# Patient Record
Sex: Male | Born: 1990 | Race: White | Hispanic: No | Marital: Single | Smoking: Former smoker
Health system: Southern US, Community
[De-identification: ages and names within clinical notes are randomized; demographics above are authoritative.]

## PROBLEM LIST (undated history)

## (undated) ENCOUNTER — Emergency Department (HOSPITAL_COMMUNITY): Payer: Commercial Managed Care - HMO

## (undated) DIAGNOSIS — F419 Anxiety disorder, unspecified: Secondary | ICD-10-CM

## (undated) DIAGNOSIS — F329 Major depressive disorder, single episode, unspecified: Secondary | ICD-10-CM

## (undated) DIAGNOSIS — Z8619 Personal history of other infectious and parasitic diseases: Secondary | ICD-10-CM

## (undated) DIAGNOSIS — B360 Pityriasis versicolor: Secondary | ICD-10-CM

## (undated) DIAGNOSIS — F32A Depression, unspecified: Secondary | ICD-10-CM

## (undated) DIAGNOSIS — K921 Melena: Secondary | ICD-10-CM

## (undated) DIAGNOSIS — F909 Attention-deficit hyperactivity disorder, unspecified type: Secondary | ICD-10-CM

## (undated) DIAGNOSIS — S62109A Fracture of unspecified carpal bone, unspecified wrist, initial encounter for closed fracture: Secondary | ICD-10-CM

## (undated) DIAGNOSIS — F17200 Nicotine dependence, unspecified, uncomplicated: Secondary | ICD-10-CM

## (undated) DIAGNOSIS — S62002K Unspecified fracture of navicular [scaphoid] bone of left wrist, subsequent encounter for fracture with nonunion: Secondary | ICD-10-CM

## (undated) HISTORY — DX: Pityriasis versicolor: B36.0

## (undated) HISTORY — DX: Anxiety disorder, unspecified: F41.9

## (undated) HISTORY — DX: Melena: K92.1

## (undated) HISTORY — DX: Depression, unspecified: F32.A

## (undated) HISTORY — PX: NO PAST SURGERIES: SHX2092

## (undated) HISTORY — DX: Nicotine dependence, unspecified, uncomplicated: F17.200

## (undated) HISTORY — DX: Major depressive disorder, single episode, unspecified: F32.9

## (undated) HISTORY — DX: Fracture of unspecified carpal bone, unspecified wrist, initial encounter for closed fracture: S62.109A

## (undated) HISTORY — DX: Personal history of other infectious and parasitic diseases: Z86.19

## (undated) HISTORY — DX: Attention-deficit hyperactivity disorder, unspecified type: F90.9

---
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
## 2011-07-10 ENCOUNTER — Encounter: Payer: Self-pay | Admitting: *Deleted

## 2011-07-10 ENCOUNTER — Emergency Department (HOSPITAL_COMMUNITY): Admission: EM | Admit: 2011-07-10 | Payer: Self-pay | Source: Home / Self Care

## 2011-07-10 NOTE — ED Notes (Signed)
Pt reports adb pain/cramping for the past couple of days, reports diff having a bm

## 2012-06-30 ENCOUNTER — Emergency Department (HOSPITAL_COMMUNITY): Payer: 59

## 2012-06-30 ENCOUNTER — Encounter (HOSPITAL_COMMUNITY): Payer: Self-pay | Admitting: Emergency Medicine

## 2012-06-30 ENCOUNTER — Emergency Department (HOSPITAL_COMMUNITY)
Admission: EM | Admit: 2012-06-30 | Discharge: 2012-06-30 | Disposition: A | Discharge status: Home / Self Care | Payer: 59 | Attending: Emergency Medicine | Admitting: Emergency Medicine

## 2012-06-30 DIAGNOSIS — S060X9A Concussion with loss of consciousness of unspecified duration, initial encounter: Secondary | ICD-10-CM

## 2012-06-30 DIAGNOSIS — W06XXXA Fall from bed, initial encounter: Secondary | ICD-10-CM | POA: Insufficient documentation

## 2012-06-30 DIAGNOSIS — S060XAA Concussion with loss of consciousness status unknown, initial encounter: Secondary | ICD-10-CM | POA: Insufficient documentation

## 2012-06-30 DIAGNOSIS — S0100XA Unspecified open wound of scalp, initial encounter: Secondary | ICD-10-CM | POA: Insufficient documentation

## 2012-06-30 DIAGNOSIS — S0101XA Laceration without foreign body of scalp, initial encounter: Secondary | ICD-10-CM

## 2012-06-30 NOTE — ED Notes (Addendum)
Pt was out of town and had been drinking.  He fell asleep on a top bunk bed and fell off and hit the back of his head on the metal rail.  Pt passed out upon impact and woke up in a pool of blood from an appx 2-3 inch lac on the back of his head.  Pt states that he has been sleepy, dizzy, and nauseated since.  Has thrown up once.

## 2012-06-30 NOTE — ED Provider Notes (Signed)
History     CSN: [id number]  Arrival date & time 06/30/12  1124   First MD Initiated Contact with Patient 06/30/12 1149      Chief Complaint  Patient presents with  . Concussion    (Consider location/radiation/quality/duration/timing/severity/associated sxs/prior treatment) HPI Comments: Pt was drinking last night and was in a bunk bed and rolled out.  He hit his head and was unconscious.  Only complaint is nausea now.   Patient is a 21 y.o. male presenting with head injury. The history is provided by the patient.  Head Injury  The incident occurred 3 to 5 hours ago. He came to the ER via walk-in. The injury mechanism was a direct blow. He lost consciousness for a period of greater than 5 minutes. The volume of blood lost was minimal. The quality of the pain is described as throbbing. The pain is at a severity of 5/10. The pain is moderate. The pain has been constant since the injury. Pertinent negatives include no numbness, no blurred vision, no vomiting, patient does not experience disorientation and no weakness. Associated symptoms comments: nausea. He has tried nothing for the symptoms. The treatment provided no relief.    Past Medical History  Diagnosis Date  . Gall stone     History reviewed. No pertinent past surgical history.  History reviewed. No pertinent family history.  History  Substance Use Topics  . Smoking status: Current Every Day Smoker -- 0.5 packs/day  . Smokeless tobacco: Not on file  . Alcohol Use: Yes      Review of Systems  Constitutional: Negative for fever.  Eyes: Negative for blurred vision.  Gastrointestinal: Negative for vomiting.  Neurological: Negative for dizziness, weakness and numbness.  All other systems reviewed and are negative.    Allergies  Review of patient's allergies indicates no known allergies.  Home Medications   Current Outpatient Rx  Name Route Sig Dispense Refill  . AMPHETAMINE-DEXTROAMPHET ER 20 MG PO CP24 Oral  Take 20 mg by mouth every morning.      BP 120/62  Pulse 71  Temp 98.3 F (36.8 C) (Oral)  Resp 17  SpO2 99%  Physical Exam  Nursing note and vitals reviewed. Constitutional: He is oriented to person, place, and time. He appears well-developed and well-nourished. No distress.  HENT:  Head: Normocephalic. Head is with laceration.    Mouth/Throat: Oropharynx is clear and moist.  Eyes: Conjunctivae normal and EOM are normal. Pupils are equal, round, and reactive to light.  Neck: Normal range of motion. Neck supple.  Cardiovascular: Normal rate, regular rhythm and intact distal pulses.   No murmur heard. Pulmonary/Chest: Effort normal and breath sounds normal. No respiratory distress. He has no wheezes. He has no rales.  Abdominal: Soft. He exhibits no distension. There is no tenderness. There is no rebound and no guarding.  Musculoskeletal: Normal range of motion. He exhibits no edema and no tenderness.  Neurological: He is alert and oriented to person, place, and time. He has normal strength. No cranial nerve deficit or sensory deficit.  Skin: Skin is warm and dry. No rash noted. No erythema.  Psychiatric: He has a normal mood and affect. His behavior is normal.    ED Course  Procedures (including critical care time)  Labs Reviewed - No data to display Ct Head Wo Contrast  06/30/2012  *RADIOLOGY REPORT*  Clinical Data: Larey Seat from top bunk striking back of head, syncope, laceration at back of head  CT HEAD WITHOUT CONTRAST  Technique:  Contiguous axial images were obtained from the base of the skull through the vertex without contrast.  Comparison: 12/12/2005  Findings: Normal ventricular morphology. No midline shift or mass effect. Normal appearance of brain parenchyma. No intracranial hemorrhage, mass lesion, or acute infarction. Visualized paranasal sinuses and mastoid air cells clear. Bones unremarkable.  IMPRESSION: No acute intracranial abnormalities.   Original Report  Authenticated By: Lollie Marrow, M.D.      1. Concussion   2. Scalp laceration       MDM   Patient with a fall from about bed last night where he hit his head on the side rail causing a large laceration and an unknown LOC. Patient woke up this morning around noon on the floor. Patient has a large hemostatic laceration over the back of his head that is already starting to heal. Head CT is negative the patient's neurologic exam is within normal limits. Patient appears to have a concussion but otherwise is well-appearing. He was given strict return cautioned discharged home.        Gwyneth Sprout, MD 06/30/12 1700

## 2012-08-13 ENCOUNTER — Encounter: Payer: Self-pay | Admitting: Family Medicine

## 2012-08-13 ENCOUNTER — Ambulatory Visit (INDEPENDENT_AMBULATORY_CARE_PROVIDER_SITE_OTHER): Payer: 59 | Admitting: Family Medicine

## 2012-08-13 VITALS — BP 110/75 | HR 86 | Temp 99.0°F | Ht 72.0 in | Wt 136.0 lb

## 2012-08-13 DIAGNOSIS — F32A Depression, unspecified: Secondary | ICD-10-CM

## 2012-08-13 DIAGNOSIS — B829 Intestinal parasitism, unspecified: Secondary | ICD-10-CM

## 2012-08-13 DIAGNOSIS — R195 Other fecal abnormalities: Secondary | ICD-10-CM

## 2012-08-13 DIAGNOSIS — F329 Major depressive disorder, single episode, unspecified: Secondary | ICD-10-CM

## 2012-08-13 MED ORDER — MEBENDAZOLE 100 MG PO CHEW: CHEWABLE_TABLET | ORAL | Status: DC

## 2012-08-13 MED ORDER — PAROXETINE HCL 20 MG PO TABS: 20.0000 mg | ORAL_TABLET | ORAL | Status: DC

## 2012-08-13 NOTE — Progress Notes (Signed)
Office Note 08/13/2012  CC:  Chief Complaint  Patient presents with  . Establish Care    ? pinworm, pt states he saw worm    HPI:  Nicholas Wu is a 21 y.o. White male who is here to establish care. Patient's most recent primary MD: Dr. Ludwig Clarks. Old records were not reviewed prior to or during today's visit.  Saw long (approx 6") worm in stool in toilet bowl 6 days ago.  No GI sx's except for one loose stool yesterday. He does walk around outside a lot and has dogs outside.  No foreign travel, no hx of parasite infection in the past that he knows of. Has recent hx of depression, was started on paxil 20mg  by his prior PCP a couple of weeks ago and he reports feeling better.  However, he says he lost his bottle of paxil yesterday and he asks if RF is possible.   He takes adderall for ADHD but does not request rf of this today.  Past Medical History  Diagnosis Date  . Depression   . ADHD (attention deficit hyperactivity disorder)     History reviewed. No pertinent past surgical history.  Family History  Problem Relation Age of Onset  . Heart disease Maternal Grandfather   . Mental illness Maternal Grandfather   . Diabetes Maternal Grandfather     History   Social History  . Marital Status: Single    Spouse Name: N/A    Number of Children: N/A  . Years of Education: N/A   Occupational History  . Not on file.   Social History Main Topics  . Smoking status: Current Every Day Smoker -- 0.5 packs/day    Types: Cigarettes  . Smokeless tobacco: Never Used  . Alcohol Use: Yes  . Drug Use: No  . Sexually Active: Yes -- Male partner(s)   Other Topics Concern  . Not on file   Social History Narrative   Single, no children.From [place].  Does restaurant work. Some college education--currently taking a break as of 07/2012.Exercises occasionally.  No dietary restrictions.    Outpatient Encounter Prescriptions as of 08/13/2012  Medication Sig Dispense Refill    . amphetamine-dextroamphetamine (ADDERALL XR) 20 MG 24 hr capsule Take 20 mg by mouth every morning.      Marland Kitchen PARoxetine (PAXIL) 20 MG tablet Take 1 tablet (20 mg total) by mouth every morning.  30 tablet  1  . [DISCONTINUED] PARoxetine (PAXIL) 20 MG tablet Take 20 mg by mouth every morning.      . mebendazole (VERMOX) 100 MG chewable tablet 1 tab twice daily for 3 days.  May repeat this in 3 weeks if needed.  6 tablet  1    No Known Allergies  ROS Review of Systems  Constitutional: Negative for fever and fatigue.  HENT: Negative for congestion and sore throat.   Eyes: Negative for visual disturbance.  Respiratory: Negative for cough.   Cardiovascular: Negative for chest pain.  Gastrointestinal: Negative for nausea and abdominal pain.  Genitourinary: Negative for dysuria.  Musculoskeletal: Negative for back pain and joint swelling.  Skin: Negative for rash.  Neurological: Negative for weakness and headaches.  Hematological: Negative for adenopathy.    PE; Blood pressure 110/75, pulse 86, temperature 99 F (37.2 C), temperature source Temporal, height 6' (1.829 m), weight 136 lb (61.689 kg), SpO2 98.00%. Gen: Alert, well appearing.  Patient is oriented to person, place, time, and situation. ENT:   Eyes: no injection, icteris, swelling, or exudate.  EOMI, PERRLA. Nose: no drainage or turbinate edema/swelling.  No injection or focal lesion.  Mouth: lips without lesion/swelling.  Oral mucosa pink and moist.  Dentition intact and without obvious caries or gingival swelling.  Oropharynx without erythema, exudate, or swelling.  Neck - No masses or thyromegaly or limitation in range of motion CV: RRR, no m/r/g.   LUNGS: CTA bilat, nonlabored resps, good aeration in all lung fields. ABD: soft, NT, ND, BS normal.  No hepatospenomegaly or mass.  No bruits. EXT: no clubbing, cyanosis, or edema.   Pertinent labs:  None today  ASSESSMENT AND PLAN:   New pt: obtain old records.  Parasites  in stool Mebendazole 200mg  bid x 3d, then may repeat in 3 wks if needed. Encouraged pt to always wear shoes outside.  Depression Improved per pt report. Pt says he lost his med bottle yesterday so I did a new rx for this today (paxil 20mg ). I recommended he f/u in office in 1 mo.   He declined flu vaccine today.  An After Visit Summary was printed and given to the patient.  Return in about 1 month (around 09/12/2012) for depression and ADHD.

## 2012-08-13 NOTE — Assessment & Plan Note (Signed)
Mebendazole 200mg  bid x 3d, then may repeat in 3 wks if needed. Encouraged pt to always wear shoes outside.

## 2012-08-13 NOTE — Assessment & Plan Note (Signed)
Improved per pt report. Pt says he lost his med bottle yesterday so I did a new rx for this today (paxil 20mg ). I recommended he f/u in office in 1 mo.

## 2012-08-14 ENCOUNTER — Other Ambulatory Visit: Payer: Self-pay | Admitting: Family Medicine

## 2012-08-14 ENCOUNTER — Telehealth: Payer: Self-pay | Admitting: *Deleted

## 2012-08-14 MED ORDER — ALBENDAZOLE 200 MG PO TABS: ORAL_TABLET | ORAL | Status: DC

## 2012-08-14 NOTE — Telephone Encounter (Signed)
PC from pharmacy:  Mebendazole is no longer made.  Please RX alternate med.

## 2012-08-14 NOTE — Telephone Encounter (Signed)
eRx sent for Albendazole.

## 2012-09-25 ENCOUNTER — Encounter: Payer: Self-pay | Admitting: Family Medicine

## 2012-09-25 ENCOUNTER — Ambulatory Visit: Payer: 59 | Admitting: Family Medicine

## 2012-09-25 ENCOUNTER — Ambulatory Visit (INDEPENDENT_AMBULATORY_CARE_PROVIDER_SITE_OTHER): Payer: 59 | Admitting: Family Medicine

## 2012-09-25 VITALS — BP 120/80 | HR 83 | Temp 99.0°F | Ht 72.0 in | Wt 145.0 lb

## 2012-09-25 DIAGNOSIS — F32A Depression, unspecified: Secondary | ICD-10-CM

## 2012-09-25 DIAGNOSIS — R195 Other fecal abnormalities: Secondary | ICD-10-CM

## 2012-09-25 DIAGNOSIS — F329 Major depressive disorder, single episode, unspecified: Secondary | ICD-10-CM

## 2012-09-25 DIAGNOSIS — B829 Intestinal parasitism, unspecified: Secondary | ICD-10-CM

## 2012-09-25 DIAGNOSIS — F909 Attention-deficit hyperactivity disorder, unspecified type: Secondary | ICD-10-CM

## 2012-09-25 MED ORDER — DEXTROAMPHETAMINE SULFATE 10 MG PO TABS: ORAL_TABLET | ORAL | Status: DC

## 2012-09-25 MED ORDER — PAROXETINE HCL 20 MG PO TABS: 20.0000 mg | ORAL_TABLET | ORAL | Status: DC

## 2012-09-25 NOTE — Assessment & Plan Note (Signed)
Switch from adderall XR to dexedrine per his request: 10mg  bid, #60, no RF. He says he has tolerated this well in the past and done well on it.

## 2012-09-25 NOTE — Progress Notes (Signed)
OFFICE NOTE  09/25/2012  CC:  Chief Complaint  Patient presents with  . Follow-up    depression, ADD     HPI: Patient is a 22 y.o. Caucasian male who is here for 6 wk follow up depression and ADHD. No more worms seen since treatment with abendazole 6 wks ago. Paxil x couple months--doing well. Adderall x few years.  Has been on different formulations of this med over the years, including dextroamphetamine--not the mixed salt.  He seems to get better results with switching things up periodically.  No signif side effects though he does mention adderall xr making him a bit dizzy. Occupation: Designer, fashion/clothing  Pertinent PMH:  Past Medical History  Diagnosis Date  . Depression   . ADHD (attention deficit hyperactivity disorder)     MEDS:  Outpatient Prescriptions Prior to Visit  Medication Sig Dispense Refill  . amphetamine-dextroamphetamine (ADDERALL XR) 20 MG 24 hr capsule Take 20 mg by mouth every morning.      Marland Kitchen PARoxetine (PAXIL) 20 MG tablet Take 1 tablet (20 mg total) by mouth every morning.  30 tablet  1  . [DISCONTINUED] albendazole (ALBENZA) 200 MG tablet Take 2 tabs as a single dose.  May repeat in 2-3 weeks as needed.  2 tablet  1  Last reviewed on 09/25/2012  1:28 PM by Jeoffrey Massed, MD  PE: Blood pressure 120/80, pulse 83, temperature 99 F (37.2 C), temperature source Temporal, height 6' (1.829 m), weight 145 lb (65.772 kg). Wt Readings from Last 2 Encounters:  09/25/12 145 lb (65.772 kg)  08/13/12 136 lb (61.689 kg)   Gen: alert, oriented x 4, affect pleasant.  Lucid thinking and conversation noted. HEENT: PERRLA, EOMI.   Neck: no LAD, mass, or thyromegaly. CV: RRR, no m/r/g LUNGS: CTA bilat, nonlabored. NEURO: no tremor or tics noted on observation.  Coordination intact. CN 2-12 grossly intact bilaterally, strength 5/5 in all extremeties.  No ataxia.   IMPRESSION AND PLAN:  Parasites in stool Resolved s/p abendazole.  Depression RF paxil x 1 mo supply.  He  says he wants to f/u again in 18mo in office and possibly ween off this med at that time.  ADHD (attention deficit hyperactivity disorder) Switch from adderall XR to dexedrine per his request: 10mg  bid, #60, no RF. He says he has tolerated this well in the past and done well on it.   He declined flu vaccine again today.  An After Visit Summary was printed and given to the patient.  FOLLOW UP: 18mo

## 2012-09-25 NOTE — Assessment & Plan Note (Addendum)
Resolved s/p abendazole.

## 2012-09-25 NOTE — Assessment & Plan Note (Signed)
RF paxil x 1 mo supply.  He says he wants to f/u again in 70mo in office and possibly ween off this med at that time.

## 2012-10-26 ENCOUNTER — Other Ambulatory Visit: Payer: Self-pay | Admitting: Family Medicine

## 2012-10-26 NOTE — Telephone Encounter (Signed)
Patient returned Stephanie's

## 2012-10-26 NOTE — Telephone Encounter (Signed)
Message left for pt to return call.  Pt is due for follow up. Refill request for DEXTROAMPHETAMINE Last filled- 09/25/12 Last seen- 09/25/12 Follow up - ONE MONTH.  Med change at last visit.   Per note from [place], pt is completely out of meds.

## 2012-10-26 NOTE — Telephone Encounter (Signed)
Pt returned call.  Advised he needs appt prior to being seen.  He is agreeable.  Pt states he can wait until appt on Wednesday for RX.

## 2012-10-28 ENCOUNTER — Encounter: Payer: Self-pay | Admitting: Family Medicine

## 2012-10-28 ENCOUNTER — Telehealth: Payer: Self-pay | Admitting: Family Medicine

## 2012-10-28 ENCOUNTER — Ambulatory Visit (INDEPENDENT_AMBULATORY_CARE_PROVIDER_SITE_OTHER): Payer: 59 | Admitting: Family Medicine

## 2012-10-28 VITALS — BP 120/82 | HR 88 | Ht 72.0 in | Wt 148.0 lb

## 2012-10-28 DIAGNOSIS — F32A Depression, unspecified: Secondary | ICD-10-CM

## 2012-10-28 DIAGNOSIS — F329 Major depressive disorder, single episode, unspecified: Secondary | ICD-10-CM

## 2012-10-28 DIAGNOSIS — F909 Attention-deficit hyperactivity disorder, unspecified type: Secondary | ICD-10-CM

## 2012-10-28 DIAGNOSIS — Z79899 Other long term (current) drug therapy: Secondary | ICD-10-CM

## 2012-10-28 MED ORDER — PAROXETINE HCL 10 MG PO TABS: 10.0000 mg | ORAL_TABLET | ORAL | Status: DC

## 2012-10-28 NOTE — Assessment & Plan Note (Signed)
He reports significant improvement (cites that he was so depressed he lost about 30 lbs last fall) on paxil 20mg  and would like to step down to 10mg  qd dose now.   Rx for this sent to pharmacy today, #30, RF x 1.

## 2012-10-28 NOTE — Assessment & Plan Note (Addendum)
Need to check UDS.  I did confirm with his pharmacy ([facility]) that he filled the dexedrine rx I gave him last visit on the day of that visit (09/25/12). I reiterated to patient today that he is to take this med only as prescribed (10mg  bid), and that he should use no illicit/illegal drugs. I cautioned his alcohol ingestion while on this med--encouraged him to cut back to drinking a beer or two only on the days he does not take his dexedrine. If UDS is clear (according to when he reports last having dexedrine and marijuana, his UDS should have NO amphetamines today and MAY have marijuana), will rx his dexedrine 30d supply and will have him come back a day or two BEFORE he is scheduled to run out and recheck UDS to make sure amphetamines show up in urine as they should if he is compliant with med as rx'd. Will resubmit records request to [facility] in [place].

## 2012-10-28 NOTE — Telephone Encounter (Signed)
MD aware

## 2012-10-28 NOTE — Telephone Encounter (Addendum)
Patient's mother states that patient walked into a Pharmacologist and asked for his medication & the doctor wrote Rx. Patient's mother feels that patient may not be on the best combination of meds and she requesting Dr. Milinda Cave to carefilly review patient's medications with him. She stated the [facility] nurse expressed concern also. Patient has been suspended from work & never seems to sleep. He is also self medicating himself with alcohol. Please do not mention to patient that his mother called.

## 2012-10-28 NOTE — Progress Notes (Addendum)
OFFICE NOTE  10/28/2012  CC:  Chief Complaint  Patient presents with  . Follow-up    ADHD     HPI: Patient is a 22 y.o. Caucasian male who is here for 1 mo f/u ADHD.  Started dexedrine 10mg  bid when last seen here 09/25/12.  He is also on paxil but had expressed some desire to possibly ween off this med when he was here last.   Says dexedrine was very helpful for focus/attention/hyperactivity sx's and he had no "crash" when med wore off like he did with adderall.  He took it "some days but not all" --didn't take it when he was off work.  Says he is out of the med, though b/c he was moving and says he required 3 tabs some days instead of 2.  I told him that he should only take this med as prescribed--no extra doses. Last took it 2/1 or 2/2.  I sensed this as a possible red flag regarding potential misuse of this controlled med, so I asked if he has used any controlled substances (rx or street) and he said no.  I then said that in light of this info and the fact that we still have no old records (these were requested months ago) I wanted to check a UDS today.  He then sheepishly said that he did do some marijuana about 10d ago and "this might show up".  He denied use of any other controlled substances, rx or street.  Says he would like to back down his paxil dose to 10mg  qd--says he feels like mood is stable and has been on this med for about 3 months.  Says this was started at [facility] (Dr. Arlyce Dice).  Pertinent PMH:  Past Medical History  Diagnosis Date  . Depression   . ADHD (attention deficit hyperactivity disorder)    History reviewed. No pertinent past surgical history.  MEDS:  Outpatient Prescriptions Prior to Visit  Medication Sig Dispense Refill  . dextroamphetamine (DEXTROSTAT) 10 MG tablet 1 tab po bid  60 tablet  0  . PARoxetine (PAXIL) 20 MG tablet Take 1 tablet (20 mg total) by mouth every morning.  30 tablet  0   Last reviewed on 10/28/2012  3:35 PM by Jeoffrey Massed,  MD  PE: Blood pressure 120/82, pulse 88, height 6' (1.829 m), weight 148 lb (67.132 kg). Wt Readings from Last 2 Encounters:  10/28/12 148 lb (67.132 kg)  09/25/12 145 lb (65.772 kg)    Gen: alert, oriented x 4, affect pleasant.  Lucid thinking and conversation noted. HEENT: PERRLA, EOMI.   Neck: no LAD, mass, or thyromegaly. CV: RRR, no m/r/g LUNGS: CTA bilat, nonlabored. NEURO: no tremor or tics noted on observation.  Coordination intact. CN 2-12 grossly intact bilaterally, strength 5/5 in all extremeties.  No ataxia.   IMPRESSION AND PLAN:  ADHD (attention deficit hyperactivity disorder) Need to check UDS. I reiterated to patient today that he is to take this med only as prescribed (10mg  bid), and that he should use no illicit/illegal drugs. I cautioned his alcohol ingestion while on this med--encouraged him to cut back to drinking a beer or two only on the days he does not take his dexedrine. If UDS is clear (according to when he reports last having dexedrine and marijuana, his UDS should have NO amphetamines today and MAY have marijuana), will rx his dexedrine 30d supply and will have him come back a day or two BEFORE he is scheduled to run out  and recheck UDS to make sure amphetamines show up in urine as they should if he is compliant with med as rx'd. Will resubmit records request to [facility] in [place].  Depression He reports significant improvement (cites that he was so depressed he lost about 30 lbs last fall) on paxil 20mg  and would like to step down to 10mg  qd dose now.   Rx for this sent to pharmacy today, #30, RF x 1.   No dexedrine rx given to patient today.  FOLLOW UP: approx 1 mo    ADDENDUM 10/29/12: UDS was clean, as expected. Will print 30d supply of dexedrine and he is to return for f/u prior to running out of med and get another UDS to document compliance.-PM

## 2012-10-29 ENCOUNTER — Encounter: Payer: Self-pay | Admitting: Family Medicine

## 2012-10-29 LAB — PRESCRIPTION ABUSE MONITORING 10P, URINE
Amphetamine/Meth: NEGATIVE ng/mL
Buprenorphine, Urine: NEGATIVE ng/mL
Creatinine, Urine: 108.84 mg/dL (ref >20.0)
Oxycodone Screen, Ur: NEGATIVE ng/mL
Propoxyphene: NEGATIVE ng/mL

## 2012-10-29 MED ORDER — DEXTROAMPHETAMINE SULFATE 10 MG PO TABS: ORAL_TABLET | ORAL | Status: DC

## 2012-10-29 NOTE — Addendum Note (Signed)
Addended by: Jeoffrey Massed on: 10/29/2012 10:46 AM   Modules accepted: Orders

## 2012-11-26 ENCOUNTER — Encounter: Payer: Self-pay | Admitting: Family Medicine

## 2012-11-26 ENCOUNTER — Ambulatory Visit (INDEPENDENT_AMBULATORY_CARE_PROVIDER_SITE_OTHER): Payer: 59 | Admitting: Family Medicine

## 2012-11-26 VITALS — BP 122/80 | HR 84 | Ht 72.0 in | Wt 147.0 lb

## 2012-11-26 DIAGNOSIS — M222X9 Patellofemoral disorders, unspecified knee: Secondary | ICD-10-CM | POA: Insufficient documentation

## 2012-11-26 DIAGNOSIS — F32A Depression, unspecified: Secondary | ICD-10-CM

## 2012-11-26 DIAGNOSIS — Z79899 Other long term (current) drug therapy: Secondary | ICD-10-CM

## 2012-11-26 DIAGNOSIS — F329 Major depressive disorder, single episode, unspecified: Secondary | ICD-10-CM

## 2012-11-26 DIAGNOSIS — M222X1 Patellofemoral disorders, right knee: Secondary | ICD-10-CM

## 2012-11-26 DIAGNOSIS — F909 Attention-deficit hyperactivity disorder, unspecified type: Secondary | ICD-10-CM

## 2012-11-26 DIAGNOSIS — M25569 Pain in unspecified knee: Secondary | ICD-10-CM

## 2012-11-26 MED ORDER — CELECOXIB 200 MG PO CAPS: 200.0000 mg | ORAL_CAPSULE | Freq: Two times a day (BID) | ORAL | Status: DC

## 2012-11-26 MED ORDER — DEXTROAMPHETAMINE SULFATE 10 MG PO TABS: ORAL_TABLET | ORAL | Status: DC

## 2012-11-26 NOTE — Assessment & Plan Note (Signed)
Problem stable.  Continue current medications and diet appropriate for this condition.  We have reviewed our general long term plan for this problem and also reviewed symptoms and signs that should prompt the patient to call or return to the office.  

## 2012-11-26 NOTE — Assessment & Plan Note (Signed)
Stable.  Need to repeat UDS today to confirm the med is in his system. I gave this month's rx to him today.  If UDS results are appropriate then he can come by an pick up the next 3 months' rx and these will have appropriate fill on/after dates.

## 2012-11-26 NOTE — Assessment & Plan Note (Addendum)
Discussed rest, ice prn, NSAIDs prn, and went over medial quad strengthening exercises. I gave #12 samples of celebrex 200mg  caps today, told him to take one a day with food.  After these run out, I recommended 600mg  OTC ibuprofen q6-8h prn. Stop tramadol.

## 2012-11-26 NOTE — Patient Instructions (Addendum)
Take celebrex 200mg , 1 cap once daily in the morning with food. When you run out of celebrex, take OTC ibuprofen (200mg  tabs) 3 tabs every 6-8 hours as needed for pain or swelling of knee.
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
## 2012-11-27 LAB — PRESCRIPTION ABUSE MONITORING 10P, URINE
Buprenorphine, Urine: NEGATIVE ng/mL
Cocaine Metabolites: NEGATIVE ng/mL
Creatinine, Urine: 31.94 mg/dL (ref >20.0)
Oxycodone Screen, Ur: NEGATIVE ng/mL
Propoxyphene: NEGATIVE ng/mL

## 2012-12-01 LAB — AMPHETAMINES (GC/LC/MS), URINE
MDA GC/MS confirm: NEGATIVE ng/mL
Methamphetamine Quant, Ur: NEGATIVE ng/mL

## 2012-12-24 ENCOUNTER — Ambulatory Visit (INDEPENDENT_AMBULATORY_CARE_PROVIDER_SITE_OTHER): Payer: 59 | Admitting: Family Medicine

## 2012-12-24 ENCOUNTER — Encounter: Payer: Self-pay | Admitting: Family Medicine

## 2012-12-24 VITALS — BP 110/80 | HR 71 | Temp 98.4°F | Ht 72.0 in | Wt 148.0 lb

## 2012-12-24 DIAGNOSIS — F909 Attention-deficit hyperactivity disorder, unspecified type: Secondary | ICD-10-CM

## 2012-12-24 DIAGNOSIS — L03012 Cellulitis of left finger: Secondary | ICD-10-CM

## 2012-12-24 DIAGNOSIS — L02519 Cutaneous abscess of unspecified hand: Secondary | ICD-10-CM

## 2012-12-24 MED ORDER — DEXTROAMPHETAMINE SULFATE 10 MG PO TABS: ORAL_TABLET | ORAL | Status: DC

## 2012-12-24 MED ORDER — AMOXICILLIN-POT CLAVULANATE 875-125 MG PO TABS: 1.0000 | ORAL_TABLET | Freq: Two times a day (BID) | ORAL | Status: DC

## 2012-12-27 ENCOUNTER — Encounter: Payer: Self-pay | Admitting: Family Medicine
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
## 2013-02-11 ENCOUNTER — Other Ambulatory Visit: Payer: Self-pay | Admitting: Family Medicine

## 2013-02-11 MED ORDER — PAROXETINE HCL 20 MG PO TABS: 20.0000 mg | ORAL_TABLET | ORAL | Status: DC

## 2013-02-11 NOTE — Progress Notes (Signed)
Per pt request I increased his paxil back up to 20mg  daily dose. Pls make sure he arranges a f/u appt in about 2 months.-thx

## 2013-02-12 NOTE — Progress Notes (Signed)
LMOM with contact name and number for return call RE: refill request w/dosage increase and further provider instructions for 2-mth F/U for future refill authorizations/SLS
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
## 2013-03-22 ENCOUNTER — Ambulatory Visit (INDEPENDENT_AMBULATORY_CARE_PROVIDER_SITE_OTHER): Payer: 59 | Admitting: Family Medicine

## 2013-03-22 ENCOUNTER — Encounter: Payer: Self-pay | Admitting: Family Medicine

## 2013-03-22 VITALS — BP 132/82 | HR 85 | Temp 97.7°F | Resp 16 | Ht 72.0 in | Wt 153.0 lb

## 2013-03-22 DIAGNOSIS — F329 Major depressive disorder, single episode, unspecified: Secondary | ICD-10-CM

## 2013-03-22 DIAGNOSIS — F32A Depression, unspecified: Secondary | ICD-10-CM

## 2013-03-22 DIAGNOSIS — R21 Rash and other nonspecific skin eruption: Secondary | ICD-10-CM

## 2013-03-22 DIAGNOSIS — F909 Attention-deficit hyperactivity disorder, unspecified type: Secondary | ICD-10-CM

## 2013-03-22 MED ORDER — DEXTROAMPHETAMINE SULFATE 10 MG PO TABS: ORAL_TABLET | ORAL | Status: DC

## 2013-03-22 NOTE — Progress Notes (Signed)
OFFICE NOTE  03/22/2013  CC:  Chief Complaint  Patient presents with  . ADHD  . Rash    on back x a couple weeks     HPI: Patient is a 22 y.o. Caucasian male who is here for 3 mo f/u ADHD, + hx of major depressive d/o.   Doing well on meds---focus is good.  Takes the dexedrine when he has to work, otherwise skips it.  Takes paxil daily. Mood is stable.    About 2 wks ago he noted return of a rash on his back that he says has come up every summer for the last 3 -4 yrs.  Does not itch or burn. In the past has been rx'd oral antifungal and says this took care of it.     Pertinent PMH:  Past Medical History  Diagnosis Date  . Depression     Recurrent; paxil has helped each time  . ADHD, predominantly inattentive type   . History of OCD (obsessive compulsive disorder)     per old PCP records  . GERD (gastroesophageal reflux disease)   . History of alcohol abuse     Esp when depressed   Past surgical, social, and family history reviewed and no changes noted since last office visit.  MEDS:  Paxil 20mg  qd, dexedrine 10mg  bid  PE: Blood pressure 132/82, pulse 85, temperature 97.7 F (36.5 C), temperature source Oral, resp. rate 16, height 6' (1.829 m), weight 153 lb (69.4 kg), SpO2 98.00%. Wt Readings from Last 2 Encounters:  03/22/13 153 lb (69.4 kg)  12/24/12 148 lb (67.132 kg)    Gen: alert, oriented x 4, affect pleasant.  Lucid thinking and conversation noted. HEENT: PERRLA, EOMI.   Neck: no LAD, mass, or thyromegaly. CV: RRR, no m/r/g LUNGS: CTA bilat, nonlabored. NEURO: no tremor or tics noted on observation.  Coordination intact. CN 2-12 grossly intact bilaterally, strength 5/5 in all extremeties.  No ataxia. SKIN: on his back he has scattered small (3mm to 1 cm) pinkish oval macules with some fine/superficial desquamation/flaking.  No herald lesion. No target lesions.  No petechiae, no vesicles, no pustules.  IMPRESSION AND PLAN:  1) ADHD: stable.  Continue  current meds.  Rxs for this month and for the 3 following months were given today. Therapeutic expectations and side effect profile of medication discussed today.  Patient's questions answered.  2) Mood d/o stable.  Continue paxil 20mg  qd.  3) Rash: tinea corperis vs pityriasis rosea. Recommended trial of bid OTC generic lamisil cream.  An After Visit Summary was printed and given to the patient.  FOLLOW UP: 45mo

## 2013-03-22 NOTE — Patient Instructions (Addendum)
Take OTC generic lamisil cream: apply to rash on back twice daily until rash has been gone for 3 days.

## 2013-05-06 ENCOUNTER — Other Ambulatory Visit: Payer: Self-pay | Admitting: Family Medicine

## 2013-05-06 MED ORDER — PAROXETINE HCL 20 MG PO TABS: 20.0000 mg | ORAL_TABLET | ORAL | Status: DC

## 2013-07-01 ENCOUNTER — Ambulatory Visit (INDEPENDENT_AMBULATORY_CARE_PROVIDER_SITE_OTHER): Payer: 59 | Admitting: Family Medicine

## 2013-07-01 ENCOUNTER — Encounter: Payer: Self-pay | Admitting: Family Medicine

## 2013-07-01 VITALS — BP 130/86 | HR 94 | Temp 99.8°F | Resp 18 | Ht 72.0 in | Wt 161.0 lb

## 2013-07-01 DIAGNOSIS — J019 Acute sinusitis, unspecified: Secondary | ICD-10-CM

## 2013-07-01 DIAGNOSIS — F329 Major depressive disorder, single episode, unspecified: Secondary | ICD-10-CM

## 2013-07-01 DIAGNOSIS — F32A Depression, unspecified: Secondary | ICD-10-CM

## 2013-07-01 DIAGNOSIS — F909 Attention-deficit hyperactivity disorder, unspecified type: Secondary | ICD-10-CM

## 2013-07-01 MED ORDER — DEXTROAMPHETAMINE SULFATE ER 10 MG PO CP24: ORAL_CAPSULE | ORAL | Status: DC

## 2013-07-01 MED ORDER — AZITHROMYCIN 250 MG PO TABS: ORAL_TABLET | ORAL | Status: DC

## 2013-07-01 MED ORDER — NEOMYCIN-POLYMYXIN-HC 1 % OT SOLN: OTIC | Status: DC

## 2013-07-01 NOTE — Assessment & Plan Note (Addendum)
Switch to dexedrin ER 10mg  tabs, take 2 qAM, #60.  Rx for this month given today, with an additional rx for Nov and for Dec--each with appropriate fill on/after dating on them. Controlled substance contract reviewed with pt and pt signed this and it will be placed in the chart.

## 2013-07-01 NOTE — Assessment & Plan Note (Signed)
The current medical regimen is effective;  continue present plan and medications.  

## 2013-07-01 NOTE — Assessment & Plan Note (Addendum)
Z-pack rx'd today. He had cerumen impaction on right that was irrigated clean by nurse today. I think he has a mild R OM and he has a bit of R OE as well. Cortisporin otic drops rx'd today.

## 2013-07-01 NOTE — Progress Notes (Signed)
OFFICE NOTE  07/01/2013  CC:  Chief Complaint  Patient presents with  . Nasal Congestion    x 5 days.  . Headache  . Medication Refill     HPI: Patient is a 22 y.o. Caucasian male who is here for f/u ADHD, depression, and also has URI sx's to discuss. Says he feels like the short acting dexedrine is effective but he feels like control was better with a long-acting stimulant.  Adderall XR made him feel like he was in a fog.  He reports that his mood is stable.  No crying spells, no SI or HI.  He is content with his response to the paxil, no adverse side effects.  He has had 5 days of severe nasal congestion/runny nose, facial congestion and pain, retro-orbital HA, ST, and cough.  No body aches.  He had some n/v and diarrhea the first couple of days of the illness as well, but this part has all resolved.  He is eating and drinking fine.  No fever or rash or neck stiffness.  Sx's worse at bedtime and in the morning.  He has not tried any OTC meds for his sx's.  Pertinent PMH:  Past Medical History  Diagnosis Date  . Depression     Recurrent; paxil has helped each time  . ADHD, predominantly inattentive type   . History of OCD (obsessive compulsive disorder)     per old PCP records  . GERD (gastroesophageal reflux disease)   . History of alcohol abuse     Esp when depressed  . Patellofemoral pain syndrome 11/26/2012    MEDS:  Outpatient Prescriptions Prior to Visit  Medication Sig Dispense Refill  . PARoxetine (PAXIL) 20 MG tablet Take 1 tablet (20 mg total) by mouth every morning.  30 tablet  2  . dextroamphetamine (DEXTROSTAT) 10 MG tablet 1 tab po bid  60 tablet  0  . celecoxib (CELEBREX) 200 MG capsule Take 1 capsule (200 mg total) by mouth 2 (two) times daily.  12 capsule  0  . amoxicillin-clavulanate (AUGMENTIN) 875-125 MG per tablet Take 1 tablet by mouth 2 (two) times daily.  28 tablet  0   No facility-administered medications prior to visit.  Not taking augmentin or  celebrex as listed above  PE: Blood pressure 130/86, pulse 94, temperature 99.8 F (37.7 C), temperature source Temporal, resp. rate 18, height 6' (1.829 m), weight 161 lb (73.029 kg), SpO2 97.00%. VS: noted--normal. Gen: alert, NAD, NONTOXIC APPEARING. HEENT: eyes without injection, drainage, or swelling.  Ears: Left EAC clear, TM a bit dull but no erythema or bulging.  Right EAC with cerumen impaction, irrigated clear by nurse today.  EAC erythematous distally but no exudate or swelling of EAC.  TM dull +loss of landmarks but no erythema.  Nose: Clear rhinorrhea, with some dried, crusty exudate adherent to mildly injected mucosa.  No purulent d/c.  Diffuse paranasal sinus TTP, R>>L.  No facial swelling.  Throat and mouth without focal lesion.  No pharyngial swelling, erythema, or exudate.   Neck: supple, no LAD.   LUNGS: CTA bilat, nonlabored resps.   CV: RRR, no m/r/g. EXT: no c/c/e SKIN: no rash NEURO: no tremor or ataxia.  AFFECT: pleasant, lucid thought and speech.    IMPRESSION AND PLAN:  ADHD (attention deficit hyperactivity disorder) Switch to dexedrin ER 10mg  tabs, take 2 qAM, #60.  Rx for this month given today, with an additional rx for Nov and for Dec--each with appropriate fill on/after dating  on them. Controlled substance contract reviewed with pt and pt signed this and it will be placed in the chart.    Sinusitis, acute Z-pack rx'd today. He had cerumen impaction on right that was irrigated clean by nurse today. I think he has a mild R OM and he has a bit of R OE as well. Cortisporin otic drops rx'd today.  Depression The current medical regimen is effective;  continue present plan and medications.    An After Visit Summary was printed and given to the patient.  FOLLOW UP: 52mo

## 2013-07-29 ENCOUNTER — Other Ambulatory Visit: Payer: Self-pay

## 2013-07-30 ENCOUNTER — Other Ambulatory Visit: Payer: Self-pay | Admitting: Family Medicine

## 2013-07-30 MED ORDER — PAROXETINE HCL 20 MG PO TABS: 20.0000 mg | ORAL_TABLET | ORAL | Status: DC

## 2013-08-02 ENCOUNTER — Encounter: Payer: Self-pay | Admitting: Family Medicine

## 2013-08-02 ENCOUNTER — Ambulatory Visit (INDEPENDENT_AMBULATORY_CARE_PROVIDER_SITE_OTHER): Payer: 59 | Admitting: Family Medicine

## 2013-08-02 VITALS — BP 124/85 | HR 99 | Temp 99.2°F | Resp 18 | Ht 72.0 in | Wt 160.0 lb

## 2013-08-02 DIAGNOSIS — B029 Zoster without complications: Secondary | ICD-10-CM

## 2013-08-02 MED ORDER — VALACYCLOVIR HCL 1 G PO TABS: ORAL_TABLET | ORAL | Status: DC

## 2013-08-02 MED ORDER — PREDNISONE 20 MG PO TABS: ORAL_TABLET | ORAL | Status: DC

## 2013-08-02 NOTE — Progress Notes (Signed)
OFFICE NOTE  08/02/2013  CC:  Chief Complaint  Patient presents with  . Rash    right sided flank x 1 week     HPI: Patient is a 22 y.o. Caucasian male who is here for approx 1 wk hx of burning/tingling lesions on skin of right anterior chest, right axilla, right mid/upper back.   NO oozing from lesions.  They are bumpy, some are crusted over and old and some have just come up in last 1-2d.  The pain waxes and wanes, not extremely intense.  He has not taken tylenol or motrin. No fevers.  Pertinent PMH:  Past Medical History  Diagnosis Date  . Depression     Recurrent; paxil has helped each time  . ADHD, predominantly inattentive type   . History of OCD (obsessive compulsive disorder)     per old PCP records  . GERD (gastroesophageal reflux disease)   . History of alcohol abuse     Esp when depressed  . Patellofemoral pain syndrome 11/26/2012    MEDS: Not taking azithromycin as listed below Outpatient Prescriptions Prior to Visit  Medication Sig Dispense Refill  . dextroamphetamine (DEXEDRINE SPANSULE) 10 MG 24 hr capsule 2 caps po qAM  60 capsule  0  . PARoxetine (PAXIL) 20 MG tablet Take 1 tablet (20 mg total) by mouth every morning.  30 tablet  3  . azithromycin (ZITHROMAX) 250 MG tablet 2 tabs po qd x 1 d, then 1 tab po qd x 4d  6 each  0  . NEOMYCIN-POLYMYXIN-HYDROCORTISONE (CORTISPORIN) 1 % SOLN otic solution 3 drops in right ear four times per day  10 mL  0   No facility-administered medications prior to visit.    PE: Blood pressure 124/85, pulse 99, temperature 99.2 F (37.3 C), temperature source Temporal, resp. rate 18, height 6' (1.829 m), weight 160 lb (72.576 kg), SpO2 97.00%. Gen: Alert, well appearing.  Patient is oriented to person, place, time, and situation. SKIN: Right side at T5 dermatomal level from anterior axillary line to midline of spine.  Lesions are interspersed through this dermatome, a few patches are crusted over and some are just barely  palpable, flat papules that have coalesced.   IMPRESSION AND PLAN:  Herpes zoster: still getting some active lesions.  Will go ahead and start valtrex 1g tid x 7d and prednisone 40mg  qd x 5d, then 20mg  qd x 5d.  Discussed lesion monitoring for secondary infection. No pain meds rx'd today.  FOLLOW UP: prn

## 2013-08-23 DIAGNOSIS — S62002K Unspecified fracture of navicular [scaphoid] bone of left wrist, subsequent encounter for fracture with nonunion: Secondary | ICD-10-CM

## 2013-08-23 HISTORY — DX: Unspecified fracture of navicular (scaphoid) bone of left wrist, subsequent encounter for fracture with nonunion: S62.002K

## 2013-08-26 ENCOUNTER — Encounter (HOSPITAL_COMMUNITY): Payer: Self-pay | Admitting: Emergency Medicine

## 2013-08-26 ENCOUNTER — Emergency Department (HOSPITAL_COMMUNITY)
Admission: EM | Admit: 2013-08-26 | Discharge: 2013-08-26 | Disposition: A | Discharge status: Home / Self Care | Payer: 59 | Source: Home / Self Care | Attending: Family Medicine | Admitting: Family Medicine

## 2013-08-26 ENCOUNTER — Emergency Department (INDEPENDENT_AMBULATORY_CARE_PROVIDER_SITE_OTHER): Payer: 59

## 2013-08-26 DIAGNOSIS — S62002A Unspecified fracture of navicular [scaphoid] bone of left wrist, initial encounter for closed fracture: Secondary | ICD-10-CM

## 2013-08-26 DIAGNOSIS — S62009A Unspecified fracture of navicular [scaphoid] bone of unspecified wrist, initial encounter for closed fracture: Secondary | ICD-10-CM

## 2013-08-26 DIAGNOSIS — Z202 Contact with and (suspected) exposure to infections with a predominantly sexual mode of transmission: Secondary | ICD-10-CM
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
## 2013-09-06 ENCOUNTER — Other Ambulatory Visit: Payer: Self-pay | Admitting: Orthopedic Surgery

## 2013-09-10 NOTE — Pre-Procedure Instructions (Signed)
Nicholas Wu at Dr. Carollee Massed office notified that we have been unable to reach pt. for pre-op instructions

## 2013-09-13 ENCOUNTER — Encounter (HOSPITAL_BASED_OUTPATIENT_CLINIC_OR_DEPARTMENT_OTHER): Payer: Self-pay | Admitting: *Deleted

## 2013-09-13 ENCOUNTER — Ambulatory Visit (HOSPITAL_BASED_OUTPATIENT_CLINIC_OR_DEPARTMENT_OTHER): Admission: RE | Admit: 2013-09-13 | Payer: 59 | Source: Ambulatory Visit | Admitting: Orthopedic Surgery

## 2013-09-13 ENCOUNTER — Encounter (HOSPITAL_BASED_OUTPATIENT_CLINIC_OR_DEPARTMENT_OTHER): Admission: RE | Payer: Self-pay | Source: Ambulatory Visit

## 2013-09-13 SURGERY — OPEN REDUCTION INTERNAL FIXATION (ORIF) WRIST FRACTURE
Anesthesia: General | Laterality: Left

## 2013-09-13 MED ORDER — BUPIVACAINE HCL (PF) 0.25 % IJ SOLN
INTRAMUSCULAR | Status: AC
  Filled 2013-09-13: qty 30

## 2013-09-20 ENCOUNTER — Encounter: Payer: Self-pay | Admitting: Family Medicine

## 2013-09-20 ENCOUNTER — Ambulatory Visit (INDEPENDENT_AMBULATORY_CARE_PROVIDER_SITE_OTHER): Payer: 59 | Admitting: Family Medicine

## 2013-09-20 VITALS — BP 146/75 | HR 95 | Temp 99.6°F | Resp 18 | Ht 72.0 in | Wt 157.0 lb

## 2013-09-20 DIAGNOSIS — F329 Major depressive disorder, single episode, unspecified: Secondary | ICD-10-CM

## 2013-09-20 DIAGNOSIS — F32A Depression, unspecified: Secondary | ICD-10-CM

## 2013-09-20 DIAGNOSIS — F909 Attention-deficit hyperactivity disorder, unspecified type: Secondary | ICD-10-CM

## 2013-09-20 DIAGNOSIS — R21 Rash and other nonspecific skin eruption: Secondary | ICD-10-CM

## 2013-09-20 MED ORDER — DEXTROAMPHETAMINE SULFATE ER 10 MG PO CP24: ORAL_CAPSULE | ORAL | Status: DC

## 2013-09-20 NOTE — Progress Notes (Signed)
Pre visit review using our clinic review tool, if applicable. No additional management support is needed unless otherwise documented below in the visit note. 

## 2013-09-20 NOTE — Progress Notes (Addendum)
OFFICE NOTE  09/30/2013  CC:  Chief Complaint  Patient presents with  . Medication Refill  . Rash     HPI: Patient is a 22 y.o. Caucasian male who is here for 3 mo f/u ADHD.   On/off rash on shoulders, top of back, and upper chest, not itchy.  Goes away with a few days of lamisil use.  Comes right back, though.  Says he has had this in the past but I have never seen him with this rash.  Compliant with paxil and dexedrine and says all is going well with these meds.  Pertinent PMH:  Past Medical History  Diagnosis Date  . Anxiety   . ADHD (attention deficit hyperactivity disorder)   . History of shingles   . Stuffy and runny nose 09/22/2013    states mostly clear drainage from nose  . 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 rate 18, height 6' (1.829 m), weight 157 lb (71.215 kg), SpO2 97.00%. Wt Readings from Last 2 Encounters:  09/22/13 155 lb (70.308 kg)  09/20/13 157 lb (71.215 kg)    Gen: alert, oriented x 4, affect pleasant.  Lucid thinking and conversation noted. HEENT: PERRLA, EOMI.   Neck: no LAD, mass, or thyromegaly. CV: RRR, no m/r/g LUNGS: CTA bilat, nonlabored. NEURO: no tremor or tics noted on observation.  Coordination intact. CN 2-12 grossly intact bilaterally, strength 5/5 in all extremeties.  No ataxia. SKIN: soft, barely palpable pinkish plaques about 3-5 mm in size, some areas of slight confluence. Areas involved are tops of shoulders, upper back, upper chest.  IMPRESSION AND PLAN:  ADHD (attention deficit hyperactivity disorder) Continue dexedrine at current dosing. Rx for this month and each of the following 2 months with approp fill on/after dating were given to patient today. Drug contract is in place in chart.  He has had appropriate results of UDS in  2014.  Depression The current medical regimen is effective;  continue present plan and medications.   Rash I do not know what this rash is. Ordered derm referral today.  Patient to get wrist surgery 09/28/13 with Dr. Janee Morn at [facility] (for nonunion of left wrist fx).  An After Visit Summary was printed and given to the patient.  FOLLOW UP: 4 mo

## 2013-09-22 ENCOUNTER — Encounter (HOSPITAL_BASED_OUTPATIENT_CLINIC_OR_DEPARTMENT_OTHER): Payer: Self-pay | Admitting: *Deleted

## 2013-09-28 ENCOUNTER — Ambulatory Visit (HOSPITAL_BASED_OUTPATIENT_CLINIC_OR_DEPARTMENT_OTHER): Admission: RE | Admit: 2013-09-28 | Payer: 59 | Source: Ambulatory Visit | Admitting: Orthopedic Surgery

## 2013-09-28 HISTORY — DX: Unspecified fracture of navicular (scaphoid) bone of left wrist, subsequent encounter for fracture with nonunion: S62.002K

## 2013-09-28 HISTORY — DX: Personal history of other infectious and parasitic diseases: Z86.19

## 2013-09-28 SURGERY — OPEN REDUCTION INTERNAL FIXATION (ORIF) WRIST FRACTURE
Anesthesia: General | Laterality: Left

## 2013-09-30 DIAGNOSIS — R21 Rash and other nonspecific skin eruption: Secondary | ICD-10-CM | POA: Insufficient documentation

## 2013-09-30 NOTE — Assessment & Plan Note (Addendum)
Continue dexedrine at current dosing. Rx for this month and each of the following 2 months with approp fill on/after dating were given to patient today. Drug contract is in place in chart.  He has had appropriate results of UDS in 2014.

## 2013-09-30 NOTE — Assessment & Plan Note (Signed)
The current medical regimen is effective;  continue present plan and medications.  

## 2013-09-30 NOTE — Assessment & Plan Note (Signed)
I do not know what this rash is. Ordered derm referral today.

## 2013-10-05 ENCOUNTER — Encounter: Payer: Self-pay | Admitting: Family Medicine

## 2013-10-07 ENCOUNTER — Encounter: Payer: Self-pay | Admitting: Family Medicine

## 2013-10-12 ENCOUNTER — Ambulatory Visit (HOSPITAL_BASED_OUTPATIENT_CLINIC_OR_DEPARTMENT_OTHER): Admission: RE | Admit: 2013-10-12 | Payer: 59 | Source: Ambulatory Visit | Admitting: Orthopedic Surgery

## 2013-10-12 ENCOUNTER — Encounter (HOSPITAL_BASED_OUTPATIENT_CLINIC_OR_DEPARTMENT_OTHER): Admission: RE | Payer: Self-pay | Source: Ambulatory Visit

## 2013-10-12 SURGERY — OPEN REDUCTION INTERNAL FIXATION (ORIF) SCAPHOID FRACTURE
Anesthesia: General | Laterality: Left

## 2013-10-27 ENCOUNTER — Telehealth: Payer: Self-pay | Admitting: Family Medicine

## 2013-10-27 NOTE — Telephone Encounter (Signed)
Relevant patient education mailed to patient.  

## 2013-11-17 ENCOUNTER — Other Ambulatory Visit: Payer: Self-pay | Admitting: Family Medicine

## 2013-11-17 MED ORDER — PAROXETINE HCL 10 MG PO TABS: 10.0000 mg | ORAL_TABLET | Freq: Every day | ORAL | Status: DC

## 2013-12-03 ENCOUNTER — Telehealth: Payer: Self-pay | Admitting: Family Medicine

## 2013-12-03 MED ORDER — PAROXETINE HCL 20 MG PO TABS: 20.0000 mg | ORAL_TABLET | Freq: Every day | ORAL | Status: DC

## 2013-12-03 NOTE — Telephone Encounter (Signed)
[facility]  per Patient request.  Please advise if he should be taking 10 mg or 20 mg tabs.

## 2013-12-03 NOTE — Telephone Encounter (Signed)
Rx for 20mg  paxil [facility] today.

## 2013-12-06 ENCOUNTER — Telehealth: Payer: Self-pay | Admitting: Family Medicine

## 2013-12-06 NOTE — Telephone Encounter (Signed)
The last time the medication was dispensed the patient got 10MG  tablets so he has been taking 2 a day to get the correct dosage. He now needs another refill.

## 2013-12-06 NOTE — Telephone Encounter (Signed)
[facility]  [facility] or paroxetine on 12/03/13.

## 2013-12-07 NOTE — Telephone Encounter (Signed)
Left message for patient

## 2013-12-20 ENCOUNTER — Ambulatory Visit: Payer: 59 | Admitting: Family Medicine

## 2013-12-21 ENCOUNTER — Encounter: Payer: Self-pay | Admitting: Family Medicine

## 2013-12-21 ENCOUNTER — Ambulatory Visit (INDEPENDENT_AMBULATORY_CARE_PROVIDER_SITE_OTHER): Payer: 59 | Admitting: Family Medicine

## 2013-12-21 ENCOUNTER — Telehealth: Payer: Self-pay | Admitting: Family Medicine

## 2013-12-21 VITALS — BP 134/78 | HR 78 | Temp 99.0°F | Resp 18 | Ht 72.0 in | Wt 153.0 lb

## 2013-12-21 DIAGNOSIS — L02429 Furuncle of limb, unspecified: Secondary | ICD-10-CM

## 2013-12-21 MED ORDER — DEXTROAMPHETAMINE SULFATE ER 10 MG PO CP24: ORAL_CAPSULE | ORAL | Status: DC

## 2013-12-21 NOTE — Telephone Encounter (Signed)
Relevant patient education mailed to patient.  

## 2013-12-21 NOTE — Progress Notes (Signed)
OFFICE NOTE  12/21/2013  CC:  Chief Complaint  Patient presents with  . Follow-up   HPI: Patient is a 23 y.o. Caucasian male who is here for 3 mo f/u adult ADHD and depression. Doing well, mood is stable as is his concentration/focus.  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  Has a boil on right inner thigh last 10d or so, hurt initially and was red.  He has squeezed it and it has drained and it is much smaller, dried at the surface now and hurts just a little bit--MUCH improved in the last 2d or so.  No fevers or malaise.  He has not applied any heat to it.  Pertinent PMH:  Past medical, surgical, social, and family history reviewed and no changes are noted since last office visit.  MEDS: Not taking adderall listed below Outpatient Prescriptions Prior to Visit  Medication Sig Dispense Refill  . PARoxetine (PAXIL) 20 MG tablet Take 1 tablet (20 mg total) by mouth daily.  30 tablet  6  . amphetamine-dextroamphetamine (ADDERALL) 20 MG tablet Take 20 mg by mouth 2 (two) times daily.       No facility-administered medications prior to visit.  Dexedrine CR 10mg , 2 caps po qAM  PE: Blood pressure 134/78, pulse 78, temperature 99 F (37.2 C), temperature source Temporal, resp. rate 18, height 6' (1.829 m), weight 153 lb (69.4 kg), SpO2 96.00%. Wt Readings from Last 2 Encounters:  12/21/13 153 lb (69.4 kg)  09/22/13 155 lb (70.308 kg)    Gen: alert, oriented x 4, affect pleasant.  Lucid thinking and conversation noted. HEENT: PERRLA, EOMI.   Neck: no LAD, mass, or thyromegaly. CV: RRR, no m/r/g LUNGS: CTA bilat, nonlabored. NEURO: no tremor or tics noted on observation.  Coordination intact. CN 2-12 grossly intact bilaterally, strength 5/5 in all extremeties.  No ataxia. Right thigh medial aspect with 2 cm subQ nodule/induration palpable beneath a bit of flaky pink/violacious patch of skin.  No erythema.  No warmth.   No streaking.  No fluctance.  Mild TTP.  IMPRESSION AND PLAN:  1) Adult ADHD; stable.   I printed rx's for dexedrine 10mg  CR, 2 caps po qAM, #60 today for April, May, and June 2015.  Appropriate fill on/after date was noted on each rx.  2) Depression: stable on paxil 20mg  qd.  3) Abscess right thigh: draining/resolving on it's own.  I feel some indurated/edematous tissue but no fluctuance to suggest a pus pocket and no sign of cellulitis. Encouraged pt to apply heat to the area, express gently if any contents start to come out the surface of the lesion.  No need for meds/abx at this time. Signs/symptoms to call or return for were reviewed and pt expressed understanding.  An After Visit Summary was printed and given to the patient.  FOLLOW UP: 6mo

## 2013-12-21 NOTE — Progress Notes (Signed)
Pre visit review using our clinic review tool, if applicable. No additional management support is needed unless otherwise documented below in the visit note. 

## 2014-03-16 ENCOUNTER — Telehealth: Payer: Self-pay | Admitting: Family Medicine

## 2014-03-16 MED ORDER — DEXTROAMPHETAMINE SULFATE ER 10 MG PO CP24: ORAL_CAPSULE | ORAL | Status: DC

## 2014-03-16 NOTE — Telephone Encounter (Signed)
Pt requesting dexedrine Rf.  Patient last OV was 12/21/13.   Last RX stated fill on or after 02/21/14.  Patient not due until 03/22/14. Please advise rf.

## 2014-03-16 NOTE — Telephone Encounter (Signed)
Rx printed: can be filled on/after 03/21/14.-thx

## 2014-03-16 NOTE — Telephone Encounter (Signed)
Patient is requesting refill on dexedrine, call when ready for pick up

## 2014-03-17 NOTE — Telephone Encounter (Signed)
Tried to contact pt but was unable to contact.

## 2014-04-19 ENCOUNTER — Ambulatory Visit: Payer: 59 | Admitting: Family Medicine

## 2014-04-20 ENCOUNTER — Encounter: Payer: Self-pay | Admitting: Family Medicine

## 2014-04-20 ENCOUNTER — Ambulatory Visit (INDEPENDENT_AMBULATORY_CARE_PROVIDER_SITE_OTHER): Payer: 59 | Admitting: Family Medicine

## 2014-04-20 VITALS — BP 134/73 | HR 69 | Temp 98.8°F | Resp 16 | Ht 72.0 in | Wt 154.0 lb

## 2014-04-20 DIAGNOSIS — F32A Depression, unspecified: Secondary | ICD-10-CM

## 2014-04-20 DIAGNOSIS — F902 Attention-deficit hyperactivity disorder, combined type: Secondary | ICD-10-CM

## 2014-04-20 DIAGNOSIS — F909 Attention-deficit hyperactivity disorder, unspecified type: Secondary | ICD-10-CM

## 2014-04-20 DIAGNOSIS — F329 Major depressive disorder, single episode, unspecified: Secondary | ICD-10-CM

## 2014-04-20 DIAGNOSIS — F3289 Other specified depressive episodes: Secondary | ICD-10-CM

## 2014-04-20 MED ORDER — DEXTROAMPHETAMINE SULFATE ER 10 MG PO CP24: ORAL_CAPSULE | ORAL | Status: DC

## 2014-04-20 NOTE — Progress Notes (Signed)
Pre visit review using our clinic review tool, if applicable. No additional management support is needed unless otherwise documented below in the visit note. 

## 2014-04-20 NOTE — Progress Notes (Signed)
OFFICE NOTE  04/20/2014  CC:  Chief Complaint  Patient presents with  . Medication Refill   HPI: Patient is a 23 y.o. Caucasian male who is here for 4 mo f/u depression and ADD. Staying busy with work and things are steady with meds. No side effects from meds. No acute complaints.  Pertinent PMH:  Past medical, surgical, social, and family history reviewed and no changes are noted since last office visit.  MEDS:  Outpatient Prescriptions Prior to Visit  Medication Sig Dispense Refill  . dextroamphetamine (DEXEDRINE SPANSULE) 10 MG 24 hr capsule 2 caps po qAM  60 capsule  0  . PARoxetine (PAXIL) 20 MG tablet Take 1 tablet (20 mg total) by mouth daily.  30 tablet  6   No facility-administered medications prior to visit.    PE: Blood pressure 134/73, pulse 69, temperature 98.8 F (37.1 C), temperature source Temporal, resp. rate 16, height 6' (1.829 m), weight 154 lb (69.854 kg), SpO2 100.00%. Wt Readings from Last 2 Encounters:  04/20/14 154 lb (69.854 kg)  12/21/13 153 lb (69.4 kg)    Gen: alert, oriented x 4, affect pleasant.  Lucid thinking and conversation noted. HEENT: PERRLA, EOMI.   Neck: no LAD, mass, or thyromegaly. CV: RRR, no m/r/g LUNGS: CTA bilat, nonlabored. NEURO: no tremor or tics noted on observation.  Coordination intact. CN 2-12 grossly intact bilaterally, strength 5/5 in all extremeties.  No ataxia.   IMPRESSION AND PLAN:  Stable ADHD and depression. He continues to work full time, active in a band--guitar. Meds helping. I printed rx's for dexedrine ER 10mg , 2 qd, #60, today for this month, August, and September 2015.  Appropriate fill on/after date was noted on each rx.  An After Visit Summary was printed and given to the patient.  FOLLOW UP:  4 mo
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
## 2014-06-30 ENCOUNTER — Other Ambulatory Visit: Payer: Self-pay | Admitting: Family Medicine

## 2014-07-12 ENCOUNTER — Other Ambulatory Visit: Payer: Self-pay | Admitting: Family Medicine

## 2014-07-12 MED ORDER — DEXTROAMPHETAMINE SULFATE ER 10 MG PO CP24: ORAL_CAPSULE | ORAL | Status: DC

## 2014-07-18 ENCOUNTER — Ambulatory Visit: Payer: 59 | Admitting: Family Medicine

## 2014-08-15 ENCOUNTER — Encounter: Payer: Self-pay | Admitting: Family Medicine

## 2014-08-15 ENCOUNTER — Ambulatory Visit (INDEPENDENT_AMBULATORY_CARE_PROVIDER_SITE_OTHER): Payer: 59 | Admitting: Family Medicine

## 2014-08-15 VITALS — BP 126/85 | HR 78 | Temp 98.6°F | Resp 16 | Ht 72.0 in | Wt 160.0 lb

## 2014-08-15 DIAGNOSIS — J329 Chronic sinusitis, unspecified: Secondary | ICD-10-CM

## 2014-08-15 DIAGNOSIS — J3089 Other allergic rhinitis: Secondary | ICD-10-CM

## 2014-08-15 DIAGNOSIS — F32A Depression, unspecified: Secondary | ICD-10-CM

## 2014-08-15 DIAGNOSIS — F418 Other specified anxiety disorders: Secondary | ICD-10-CM

## 2014-08-15 DIAGNOSIS — F900 Attention-deficit hyperactivity disorder, predominantly inattentive type: Secondary | ICD-10-CM

## 2014-08-15 DIAGNOSIS — F419 Anxiety disorder, unspecified: Secondary | ICD-10-CM

## 2014-08-15 DIAGNOSIS — F329 Major depressive disorder, single episode, unspecified: Secondary | ICD-10-CM

## 2014-08-15 DIAGNOSIS — F909 Attention-deficit hyperactivity disorder, unspecified type: Secondary | ICD-10-CM

## 2014-08-15 MED ORDER — MOMETASONE FUROATE 50 MCG/ACT NA SUSP: NASAL | Status: DC

## 2014-08-15 MED ORDER — DEXTROAMPHETAMINE SULFATE ER 10 MG PO CP24: ORAL_CAPSULE | ORAL | Status: DC

## 2014-08-15 NOTE — Progress Notes (Signed)
OFFICE NOTE  08/15/2014  CC:  Chief Complaint  Patient presents with  . Follow-up   HPI: Patient is a 23 y.o. Caucasian male who is here for 4 mo f/u adult ADD and anxiety/depression. Doing well, restaurant work full time, surveying part time. Compliant with dexedrine and paxil, no side effects. Currently smoking; not contemplating quitting at this time.  Describes recurrent allergy symptoms: nasal sx's/PND/stuffiness.  Has seen allergist before years ago, dx'd with severe allergies to dust mites, grasses, cats.  He has done neti pot with eucalyptis solution in the past.  Has been rx'd steroid nasal spray.  Was on allergy shots as a child.  Pertinent PMH:  Past medical, surgical, social, and family history reviewed and no changes are noted since last office visit.  MEDS:  Outpatient Prescriptions Prior to Visit  Medication Sig Dispense Refill  . dextroamphetamine (DEXEDRINE SPANSULE) 10 MG 24 hr capsule 2 caps po qAM 60 capsule 0  . PARoxetine (PAXIL) 20 MG tablet TAKE 1 TABLET ONCE DAILY. 30 tablet 3   No facility-administered medications prior to visit.    PE: Blood pressure 126/85, pulse 78, temperature 98.6 F (37 C), temperature source Temporal, resp. rate 16, height 6' (1.829 m), weight 160 lb (72.576 kg), SpO2 99 %. VS: noted--normal. Gen: alert, NAD, NONTOXIC APPEARING. HEENT: eyes without injection, drainage, or swelling.  Ears: EACs clear, TMs with normal light reflex and landmarks.  Nose: Clear rhinorrhea, with some dried, crusty exudate adherent to mildly injected mucosa.  No purulent d/c.  No paranasal sinus TTP.  No facial swelling.  Throat and mouth without focal lesion.  No pharyngial swelling, erythema, or exudate.   Neck: supple, no LAD.   LUNGS: CTA bilat, nonlabored resps.   CV: RRR, no m/r/g. EXT: no c/c/e SKIN: no rash   IMPRESSION AND PLAN:  1) Adult ADD; The current medical regimen is effective;  continue present plan and medications. I printed  rx's for Dexedrine 10mg , 2 caps qd, #60 today for this month, Dec 2015, and Jan 2016.  Appropriate fill on/after date was noted on each rx.  2) Anxiety and depression: The current medical regimen is effective;  continue present plan and medications.  3) Recurrent allergic sinusitis, chronic (mostly seasonal) allergic rhinitis.  Currently not medicated but in the past medicated with "everything at one time or another".  Hx of deviated septum. Nasonex 2 sprays each nostril qd to start.  He is a minimalist, wants to avoid pills at this time. X-ray of sinuses ordered.  An After Visit Summary was printed and given to the patient.  FOLLOW UP: 4 mo

## 2014-08-15 NOTE — Progress Notes (Signed)
Pre visit review using our clinic review tool, if applicable. No additional management support is needed unless otherwise documented below in the visit note. 

## 2014-10-13 ENCOUNTER — Other Ambulatory Visit: Payer: Self-pay | Admitting: Family Medicine

## 2014-11-09 ENCOUNTER — Ambulatory Visit: Payer: Self-pay | Admitting: Family Medicine

## 2014-11-10 ENCOUNTER — Ambulatory Visit (INDEPENDENT_AMBULATORY_CARE_PROVIDER_SITE_OTHER): Payer: 59 | Admitting: Family Medicine

## 2014-11-10 ENCOUNTER — Encounter: Payer: Self-pay | Admitting: Family Medicine

## 2014-11-10 VITALS — BP 128/80 | HR 79 | Temp 98.6°F | Resp 18 | Ht 72.0 in | Wt 163.0 lb

## 2014-11-10 DIAGNOSIS — F900 Attention-deficit hyperactivity disorder, predominantly inattentive type: Secondary | ICD-10-CM

## 2014-11-10 DIAGNOSIS — F32A Depression, unspecified: Secondary | ICD-10-CM

## 2014-11-10 DIAGNOSIS — F329 Major depressive disorder, single episode, unspecified: Secondary | ICD-10-CM

## 2014-11-10 DIAGNOSIS — J302 Other seasonal allergic rhinitis: Secondary | ICD-10-CM

## 2014-11-10 DIAGNOSIS — J3089 Other allergic rhinitis: Secondary | ICD-10-CM | POA: Insufficient documentation

## 2014-11-10 MED ORDER — DEXTROAMPHETAMINE SULFATE ER 10 MG PO CP24: ORAL_CAPSULE | ORAL | Status: DC

## 2014-11-10 NOTE — Progress Notes (Signed)
OFFICE NOTE  11/10/2014  CC:  Chief Complaint  Patient presents with  . Follow-up   HPI: Patient is a 24 y.o. Caucasian male who is here for 3 mo f/u adult ADD and anxiety/depression. Last visit we started nasonex for seasonal allerg rhinitis and I ordered a sinus x-ray.  He never went to get the sinus x-ray. Nasonex made nose irritated and bleed so he is now just doing saline or water irrigation and feels like this suffices for the time being.  Focus stable on dexedrine, takes it daily w/out adverse side effect.  Says mood is stable except for dealing with the recent breakup with his girlfriend and he feels like he is dealing with this fine.   Pertinent PMH:  Past medical, surgical, social, and family history reviewed and no changes are noted since last office visit.  MEDS:  Outpatient Prescriptions Prior to Visit  Medication Sig Dispense Refill  . dextroamphetamine (DEXEDRINE SPANSULE) 10 MG 24 hr capsule 2 caps po qAM 60 capsule 0  . mometasone (NASONEX) 50 MCG/ACT nasal spray 2 sprays in each nostril once every morning 17 g 12  . PARoxetine (PAXIL) 20 MG tablet TAKE 1 TABLET ONCE DAILY. 30 tablet 3   No facility-administered medications prior to visit.    PE: Blood pressure 128/80, pulse 79, temperature 98.6 F (37 C), temperature source Temporal, resp. rate 18, height 6' (1.829 m), weight 163 lb (73.936 kg), SpO2 97 %. Wt Readings from Last 2 Encounters:  11/10/14 163 lb (73.936 kg)  08/15/14 160 lb (72.576 kg)   Gen: alert, oriented x 4, affect pleasant.  Lucid thinking and conversation noted. HEENT: PERRLA, EOMI.   Neck: no LAD, mass, or thyromegaly. CV: RRR, no m/r/g LUNGS: CTA bilat, nonlabored. NEURO: no tremor or tics noted on observation.  Coordination intact.  Patellar DTRs 3+ both patellar areas, 2+ both achilles areas, 1 beat clonus bilat.  CN 2-12 grossly intact bilaterally, strength 5/5 in all extremeties.  No ataxia.   IMPRESSION AND PLAN:  1) Adult  ADD: The current medical regimen is effective;  continue present plan and medications. I printed rx's for dexedrine 24hr10mg   cap, 2 caps po qAM, #60 today for this month, March 2016, and April 2016.  Appropriate fill on/after date was noted on each rx.  2) Depression and anxiety; The current medical regimen is effective;  continue present plan and medications.  3) Allergic rhinitis: he is satisfied with current treatment of water or saline nasal rinses.  An After Visit Summary was printed and given to the patient.  FOLLOW UP: 3 mo

## 2014-11-10 NOTE — Progress Notes (Signed)
Pre visit review using our clinic review tool, if applicable. No additional management support is needed unless otherwise documented below in the visit note. 

## 2014-12-23 ENCOUNTER — Encounter: Payer: Self-pay | Admitting: Gastroenterology

## 2015-01-20 ENCOUNTER — Ambulatory Visit (INDEPENDENT_AMBULATORY_CARE_PROVIDER_SITE_OTHER): Payer: 59 | Admitting: Gastroenterology

## 2015-01-20 ENCOUNTER — Other Ambulatory Visit (INDEPENDENT_AMBULATORY_CARE_PROVIDER_SITE_OTHER): Payer: 59

## 2015-01-20 ENCOUNTER — Encounter: Payer: Self-pay | Admitting: Gastroenterology

## 2015-01-20 VITALS — BP 118/72 | HR 76 | Ht 72.0 in | Wt 162.5 lb

## 2015-01-20 DIAGNOSIS — M25552 Pain in left hip: Secondary | ICD-10-CM | POA: Diagnosis not present

## 2015-01-20 DIAGNOSIS — R1032 Left lower quadrant pain: Secondary | ICD-10-CM

## 2015-01-20 DIAGNOSIS — R079 Chest pain, unspecified: Secondary | ICD-10-CM

## 2015-01-20 DIAGNOSIS — R1011 Right upper quadrant pain: Secondary | ICD-10-CM

## 2015-01-20 DIAGNOSIS — K5900 Constipation, unspecified: Secondary | ICD-10-CM

## 2015-01-20 LAB — CBC WITH DIFFERENTIAL/PLATELET
BASOS ABS: 0.1 10*3/uL (ref 0.0–0.1)
BASOS PCT: 0.9 % (ref 0.0–3.0)
Eosinophils Absolute: 0.2 10*3/uL (ref 0.0–0.7)
Eosinophils Relative: 2.6 % (ref 0.0–5.0)
HCT: 41.4 % (ref 39.0–52.0)
Hemoglobin: 14.6 g/dL (ref 13.0–17.0)
LYMPHS ABS: 2.6 10*3/uL (ref 0.7–4.0)
Lymphocytes Relative: 34.9 % (ref 12.0–46.0)
MCHC: 35.2 g/dL (ref 30.0–36.0)
MCV: 86.7 fl (ref 78.0–100.0)
Monocytes Absolute: 0.5 10*3/uL (ref 0.1–1.0)
Monocytes Relative: 6.6 % (ref 3.0–12.0)
NEUTROS PCT: 55 % (ref 43.0–77.0)
Neutro Abs: 4.1 10*3/uL (ref 1.4–7.7)
PLATELETS: 287 10*3/uL (ref 150.0–400.0)
RBC: 4.77 Mil/uL (ref 4.22–5.81)
RDW: 12.8 % (ref 11.5–15.5)
WBC: 7.5 10*3/uL (ref 4.0–10.5)

## 2015-01-20 LAB — HEPATIC FUNCTION PANEL
ALT: 19 U/L (ref 0–53)
AST: 22 U/L (ref 0–37)
Albumin: 4.4 g/dL (ref 3.5–5.2)
Alkaline Phosphatase: 57 U/L (ref 39–117)
BILIRUBIN TOTAL: 0.5 mg/dL (ref 0.2–1.2)
Bilirubin, Direct: 0.1 mg/dL (ref 0.0–0.3)
TOTAL PROTEIN: 7.3 g/dL (ref 6.0–8.3)

## 2015-01-20 LAB — BASIC METABOLIC PANEL
BUN: 8 mg/dL (ref 6–23)
CO2: 30 meq/L (ref 19–32)
CREATININE: 0.8 mg/dL (ref 0.40–1.50)
Calcium: 9.4 mg/dL (ref 8.4–10.5)
Chloride: 104 mEq/L (ref 96–112)
GFR: 125.94 mL/min (ref >60.00)
Glucose, Bld: 78 mg/dL (ref 70–99)
POTASSIUM: 3.8 meq/L (ref 3.5–5.1)
Sodium: 141 mEq/L (ref 135–145)

## 2015-01-20 LAB — TSH: TSH: 1.23 u[IU]/mL (ref 0.35–4.50)

## 2015-01-20 NOTE — Progress Notes (Signed)
    History of Present Illness: This is a 24 year old male who presents for the evaluation of multiple complaints including abdominal pain, constipation. he notes a 2-3 month history of recurrent problems with pain along his right costal margin and pain in his left lower quadrant at his left hip. The symptoms are brought on by exercising and the patient has significantly limited his exercise. In addition after exercise he notes a numbness and pain radiating into his left thigh and pain radiating to his left testicle. He notes abdominal bloating and a loss of appetite along with a change in bowel habits over the past 2-3 months-instead of 2-3 stools per day he now has a stool once daily or every other day. Denies weight loss, diarrhea, change in stool caliber, melena, hematochezia, nausea, vomiting, dysphagia, reflux symptoms, chest pain.   Review of Systems: Pertinent positive and negative review of systems were noted in the above HPI section. All other review of systems were otherwise negative.  Current Medications, Allergies, Past Medical History, Past Surgical History, Family History and Social History were reviewed in [facility] electronic medical record.  Physical Exam: General: Well developed, well nourished, no acute distress Head: Normocephalic and atraumatic Eyes:  sclerae anicteric, EOMI Ears: Normal auditory acuity Mouth: No deformity or lesions Neck: Supple, no masses or thyromegaly Lungs: Clear throughout to auscultation, mild tenderness at right costal margin Heart: Regular rate and rhythm; no murmurs, rubs or bruits Abdomen: Soft, mild tenderness at left iliac crest and non distended. No masses, hepatosplenomegaly or hernias noted. Normal Bowel sounds Musculoskeletal: Symmetrical with no gross deformities  Skin: No lesions on visible extremities Pulses:  Normal pulses noted Extremities: No clubbing, cyanosis, edema or deformities noted Neurological: Alert oriented x 4,  grossly nonfocal Cervical Nodes:  No significant cervical adenopathy Inguinal Nodes: No significant inguinal adenopathy Psychological:  Alert and cooperative. Normal mood and affect  Assessment and Recommendations:  1. Right costal margin/right upper quadrant and left iliac crest/left lower quadrant pain. Change in bowel habits, constipation and abdominal bloating. Numbness in his left thigh and left testicular pain. I do not have a unifying diagnosis. Symptoms are likely multifactorial. I've advised him to return to his PCP for further evaluation. Proceed with standard blood work and an abdominal/pelvic CT scan. Begin MiraLAX once or twice daily titrated for adequate bowel movements. Return office visit in one month.

## 2015-01-20 NOTE — Patient Instructions (Addendum)
Your physician has requested that you go to the basement for lab work before leaving today.  You can take over the counter Miralax mixing 17 grams in 8 ox of water 1-2 x daily for constipation.  You have been scheduled for a CT scan of the abdomen and pelvis at [facility] ([address]---this is in the same building as Press [facility]).   You are scheduled on 01/25/15 at 10:30am. You should arrive 15 minutes prior to your appointment time for registration. Please follow the written instructions below on the day of your exam:  WARNING: IF YOU ARE ALLERGIC TO IODINE/X-RAY DYE, PLEASE NOTIFY RADIOLOGY IMMEDIATELY AT [phone]! YOU WILL BE GIVEN A 13 HOUR PREMEDICATION PREP.  1) Do not eat or drink anything after 6:30am (4 hours prior to your test) 2) You have been given 2 bottles of oral contrast to drink. The solution may taste better if refrigerated, but do NOT add ice or any other liquid to this solution. Shake  well before drinking.    Drink 1 bottle of contrast @ 8:30am (2 hours prior to your exam)  Drink 1 bottle of contrast @ 9:30am (1 hour prior to your exam)  You may take any medications as prescribed with a small amount of water except for the following: Metformin, Glucophage, Glucovance, Avandamet, Riomet, Fortamet, Actoplus Met, Janumet, Glumetza or Metaglip. The above medications must be held the day of the exam AND 48 hours after the exam.  The purpose of you drinking the oral contrast is to aid in the visualization of your intestinal tract. The contrast solution may cause some diarrhea. Before your exam is started, you will be given a small amount of fluid to drink. Depending on your individual set of symptoms, you may also receive an intravenous injection of x-ray contrast/dye. Plan on being at [facility] for 30 minutes or long, depending on the type of exam you are having performed.  This test typically takes 30-45 minutes to complete.  If you have any  questions regarding your exam or if you need to reschedule, you may call the CT department at [phone] between the hours of 8:00 am and 5:00 pm, Monday-Friday.  ________________________________________________________________________  Follow up with your primary care physician for your leg pain, hip pain, testicular pain.   Thank you for choosing me and [facility].  Pricilla Riffle. Dagoberto Ligas., MD., Marval Regal  cc: Shawnie Dapper, MD

## 2015-01-25 ENCOUNTER — Inpatient Hospital Stay: Admission: RE | Admit: 2015-01-25 | Payer: 59 | Source: Ambulatory Visit

## 2015-01-27 ENCOUNTER — Inpatient Hospital Stay: Admission: RE | Admit: 2015-01-27 | Payer: 59 | Source: Ambulatory Visit

## 2015-01-30 ENCOUNTER — Other Ambulatory Visit: Payer: 59

## 2015-02-03 ENCOUNTER — Ambulatory Visit (INDEPENDENT_AMBULATORY_CARE_PROVIDER_SITE_OTHER): Payer: 59 | Admitting: Family Medicine

## 2015-02-03 ENCOUNTER — Encounter: Payer: Self-pay | Admitting: Family Medicine

## 2015-02-03 ENCOUNTER — Ambulatory Visit (INDEPENDENT_AMBULATORY_CARE_PROVIDER_SITE_OTHER)
Admission: RE | Admit: 2015-02-03 | Discharge: 2015-02-03 | Disposition: A | Discharge status: Home / Self Care | Payer: 59 | Source: Ambulatory Visit | Attending: Gastroenterology | Admitting: Gastroenterology

## 2015-02-03 VITALS — BP 117/73 | HR 69 | Temp 97.9°F | Resp 16 | Wt 164.0 lb

## 2015-02-03 DIAGNOSIS — F419 Anxiety disorder, unspecified: Secondary | ICD-10-CM

## 2015-02-03 DIAGNOSIS — F32A Depression, unspecified: Secondary | ICD-10-CM

## 2015-02-03 DIAGNOSIS — R1032 Left lower quadrant pain: Secondary | ICD-10-CM

## 2015-02-03 DIAGNOSIS — B001 Herpesviral vesicular dermatitis: Secondary | ICD-10-CM

## 2015-02-03 DIAGNOSIS — F418 Other specified anxiety disorders: Secondary | ICD-10-CM

## 2015-02-03 DIAGNOSIS — R1011 Right upper quadrant pain: Secondary | ICD-10-CM | POA: Diagnosis not present

## 2015-02-03 DIAGNOSIS — F900 Attention-deficit hyperactivity disorder, predominantly inattentive type: Secondary | ICD-10-CM

## 2015-02-03 DIAGNOSIS — M25552 Pain in left hip: Secondary | ICD-10-CM | POA: Diagnosis not present

## 2015-02-03 DIAGNOSIS — F909 Attention-deficit hyperactivity disorder, unspecified type: Secondary | ICD-10-CM

## 2015-02-03 DIAGNOSIS — F329 Major depressive disorder, single episode, unspecified: Secondary | ICD-10-CM

## 2015-02-03 MED ORDER — PAROXETINE HCL 20 MG PO TABS: 20.0000 mg | ORAL_TABLET | Freq: Every day | ORAL | Status: DC

## 2015-02-03 MED ORDER — DEXTROAMPHETAMINE SULFATE ER 10 MG PO CP24: ORAL_CAPSULE | ORAL | Status: DC

## 2015-02-03 MED ORDER — IOHEXOL 300 MG/ML  SOLN
100.0000 mL | Freq: Once | INTRAMUSCULAR | Status: AC | PRN
Start: 2015-02-03 — End: 2015-02-03
  Administered 2015-02-03: 100 mL via INTRAVENOUS

## 2015-02-03 NOTE — Progress Notes (Signed)
OFFICE NOTE  02/03/2015  CC:  Chief Complaint  Patient presents with  . Follow-up    Pt is not fasting     HPI: Patient is a 24 y.o. Caucasian male who is here for 3 mo f/u adult ADHD + hx of anxiety/depression. Doing well, taking dexedrine and paxil daily, mood stable, attention/focus good.  No med side effects. Busy with 2 jobs. Small sore/pimple next to lip on left side recently, now dry and taking a long time to heal b/c it pulls open every time he opens his jaw wide.  Pertinent PMH:  Past medical, surgical, social, and family history reviewed and no changes are noted since last office visit.  MEDS:  Outpatient Prescriptions Prior to Visit  Medication Sig Dispense Refill  . dextroamphetamine (DEXEDRINE SPANSULE) 10 MG 24 hr capsule 2 caps po qAM 60 capsule 0  . PARoxetine (PAXIL) 20 MG tablet TAKE 1 TABLET ONCE DAILY. 30 tablet 3   No facility-administered medications prior to visit.    PE: Blood pressure 117/73, pulse 69, temperature 97.9 F (36.6 C), temperature source Oral, resp. rate 16, weight 164 lb (74.39 kg), SpO2 99 %. Wt Readings from Last 2 Encounters:  02/03/15 164 lb (74.39 kg)  01/20/15 162 lb 8 oz (73.71 kg)    Gen: alert, oriented x 4, affect pleasant.  Lucid thinking and conversation noted. HEENT: PERRLA, EOMI.   Just lateral to corner of mouth on left side he has a small, dry ulceration w/out erythema or tenderness.   Oropharynx/oral cavity is w/out lesion.   Neck: no LAD, mass, or thyromegaly. CV: RRR, no m/r/g LUNGS: CTA bilat, nonlabored. NEURO: no tremor or tics noted on observation.  Coordination intact. CN 2-12 grossly intact bilaterally, strength 5/5 in all extremeties.  No ataxia.   IMPRESSION AND PLAN:  1) Adult ADHD; The current medical regimen is effective;  continue present plan and medications. I printed rx's for dexedrine 10mg  24h caps, #60 today for this month, June 2016, and July 2016.  Appropriate fill on/after date was noted  on each rx.  2) Hx of anxiety and depression: has had long term stability on current dosing of paxil. Continue this, RF x 1 yr given today.  3) Healing cold sore next to lip/corner of mouth: apply OTC aquafor ointment several times per day.  An After Visit Summary was printed and given to the patient.  FOLLOW UP: 4 mo

## 2015-02-03 NOTE — Progress Notes (Signed)
Pre visit review using our clinic review tool, if applicable. No additional management support is needed unless otherwise documented below in the visit note. 

## 2015-02-03 NOTE — Patient Instructions (Signed)
Buy OTC ointment called aquafor (smallest size) and apply a dab of ointment several times a day to the area on your lip.

## 2015-03-24 DIAGNOSIS — Z8619 Personal history of other infectious and parasitic diseases: Secondary | ICD-10-CM

## 2015-03-24 HISTORY — DX: Personal history of other infectious and parasitic diseases: Z86.19

## 2015-04-16 ENCOUNTER — Ambulatory Visit (INDEPENDENT_AMBULATORY_CARE_PROVIDER_SITE_OTHER): Payer: Commercial Managed Care - HMO | Admitting: Emergency Medicine

## 2015-04-16 VITALS — BP 124/76 | HR 104 | Temp 98.4°F | Resp 17 | Ht 70.5 in | Wt 153.0 lb

## 2015-04-16 DIAGNOSIS — B779 Ascariasis, unspecified: Secondary | ICD-10-CM

## 2015-04-16 DIAGNOSIS — R21 Rash and other nonspecific skin eruption: Secondary | ICD-10-CM

## 2015-04-16 MED ORDER — CETIRIZINE HCL 10 MG PO TABS: 10.0000 mg | ORAL_TABLET | Freq: Every day | ORAL | Status: DC

## 2015-04-16 MED ORDER — ALBENDAZOLE 200 MG PO TABS: 400.0000 mg | ORAL_TABLET | Freq: Once | ORAL | Status: DC

## 2015-04-16 NOTE — Progress Notes (Signed)
   Subjective:    Patient ID: Nicholas Wu, male    DOB: 08-03-1991, 24 y.o.   MRN: [id number]  HPI Patient presents for 4 days of diarrhea and endorses have up to 5 episodes of watery diarrhea that also consist of several white particles and worms. Endorses nausea as well, but denies vomiting. States he did have fever as he has felt hot, but was unable to check temp. Is still able to eat. Has rash over entire body for past 2 days. 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 Does not always wash hands as well as he should. Also endorses eating mostly rare meat. Only sears meat on each side, but does not cook all the way through.   Med allergy nasonex.  Review of Systems  Constitutional: Positive for fever (resolved) and chills. Negative for appetite change.  Gastrointestinal: Positive for nausea, abdominal pain and diarrhea. Negative for vomiting, constipation, blood in stool and abdominal distention.  Genitourinary: Negative.        Objective:   Physical Exam  Constitutional: He is oriented to person, place, and time. He appears well-developed and well-nourished. No distress.  Blood pressure 124/76, pulse 104, temperature 98.4 F (36.9 C), temperature source Oral, resp. rate 17, height 5' 10.5" (1.791 m), weight 153 lb (69.4 kg), SpO2 97 %.   HENT:  Head: Normocephalic and atraumatic.  Right Ear: External ear normal.  Left Ear: External ear normal.  Eyes: Conjunctivae are normal. Right eye exhibits no discharge. Left eye exhibits no discharge. No scleral icterus.  Cardiovascular: Normal rate, regular rhythm and normal heart sounds.  Exam reveals no gallop and no friction rub.   No murmur heard. Pulmonary/Chest: Effort normal and breath sounds normal. No respiratory distress. He has no wheezes. He has no rales.  Abdominal: Soft. Bowel sounds are normal. He exhibits no distension. There is no tenderness. There is no rebound and no guarding.  Genitourinary:  Stool sample  present with round worm in stool  Neurological: He is alert and oriented to person, place, and time.  Skin: Skin is warm and dry. Rash (hives on neck, chest, back, stomach, and arms) noted. He is not diaphoretic. No erythema. No pallor.  Psychiatric: He has a normal mood and affect. His behavior is normal. Judgment and thought content normal.      Assessment & Plan:  1. Ascariasis - albendazole (ALBENZA) 200 MG tablet; Take 2 tablets (400 mg total) by mouth once.  Dispense: 1 tablet; Refill: 0 - Ova and parasite examination  2. Skin eruption - cetirizine (ZYRTEC) 10 MG tablet; Take 1 tablet (10 mg total) by mouth daily.  Dispense: 30 tablet; Refill: 11   Praise Dolecki PA-C  [facility] 04/16/2015 2:59 PM

## 2015-04-20 LAB — OVA AND PARASITE EXAMINATION: OP: NONE SEEN

## 2015-04-21 ENCOUNTER — Encounter: Payer: Self-pay | Admitting: Physician Assistant

## 2015-05-05 ENCOUNTER — Encounter: Payer: Self-pay | Admitting: Family Medicine

## 2015-05-05 ENCOUNTER — Ambulatory Visit (INDEPENDENT_AMBULATORY_CARE_PROVIDER_SITE_OTHER): Payer: Commercial Managed Care - HMO | Admitting: Family Medicine

## 2015-05-05 VITALS — BP 108/72 | HR 72 | Temp 98.5°F | Resp 16 | Ht 72.0 in | Wt 161.8 lb

## 2015-05-05 DIAGNOSIS — F329 Major depressive disorder, single episode, unspecified: Secondary | ICD-10-CM

## 2015-05-05 DIAGNOSIS — F411 Generalized anxiety disorder: Secondary | ICD-10-CM | POA: Diagnosis not present

## 2015-05-05 DIAGNOSIS — F32A Depression, unspecified: Secondary | ICD-10-CM

## 2015-05-05 DIAGNOSIS — B779 Ascariasis, unspecified: Secondary | ICD-10-CM

## 2015-05-05 DIAGNOSIS — F902 Attention-deficit hyperactivity disorder, combined type: Secondary | ICD-10-CM

## 2015-05-05 MED ORDER — DEXTROAMPHETAMINE SULFATE ER 10 MG PO CP24
ORAL_CAPSULE | ORAL | Status: DC
Start: 2015-05-05 — End: 2015-08-01

## 2015-05-05 MED ORDER — DEXTROAMPHETAMINE SULFATE ER 10 MG PO CP24: ORAL_CAPSULE | ORAL | Status: DC

## 2015-05-05 MED ORDER — ALBENDAZOLE 200 MG PO TABS: 400.0000 mg | ORAL_TABLET | Freq: Once | ORAL | Status: DC

## 2015-05-05 NOTE — Progress Notes (Signed)
Pre visit review using our clinic review tool, if applicable. No additional management support is needed unless otherwise documented below in the visit note. 

## 2015-05-05 NOTE — Progress Notes (Signed)
OFFICE VISIT  05/05/2015   CC:  Chief Complaint  Patient presents with  . Follow-up    needs refills today   HPI:    Patient is a 24 y.o. Caucasian male who presents for 2 mo f/u adult ADD, anxiety/depression. Doing well, no complaints. Had ascariasis infection with complication (fever, hives, malaise, wt loss)--treated 3 wks ago with abendazole 400 mg x 1 dose, asks about whether or not he needs another dose or not.  Hives clearing up but not completely resolved. No more diarrhea.  Eating well, no malaise, has gained wt back now.  Got a lot of mucous out of nose as well (?related?).  Mood is good, focus fine.    Past Medical History  Diagnosis Date  . Anxiety and depression   . ADHD (attention deficit hyperactivity disorder)   . History of shingles   . 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 Mack Hook with [facility] plans to do surgery as of 10/01/13.  . Tobacco dependence   . H/O ascariasis 03/2015    with associated hives    Past Surgical History  Procedure Laterality Date  . No past surgeries      Outpatient Prescriptions Prior to Visit  Medication Sig Dispense Refill  . PARoxetine (PAXIL) 20 MG tablet Take 1 tablet (20 mg total) by mouth daily. 30 tablet 11  . dextroamphetamine (DEXEDRINE SPANSULE) 10 MG 24 hr capsule 2 caps po qAM 60 capsule 0  . cetirizine (ZYRTEC) 10 MG tablet Take 1 tablet (10 mg total) by mouth daily. (Patient not taking: Reported on 05/05/2015) 30 tablet 11  . albendazole (ALBENZA) 200 MG tablet Take 2 tablets (400 mg total) by mouth once. (Patient not taking: Reported on 05/05/2015) 1 tablet 0   No facility-administered medications prior to visit.    Allergies  Allergen Reactions  . Nasonex [Mometasone] Other (See Comments)    Nasal irritation/nosebleeds   ROS As per HPI  PE: Blood pressure 108/72, pulse 72, temperature 98.5 F (36.9 C), temperature source Oral, resp. rate 16, height 6'  (1.829 m), weight 161 lb 12.8 oz (73.392 kg), SpO2 98 %. Wt Readings from Last 2 Encounters:  05/05/15 161 lb 12.8 oz (73.392 kg)  04/16/15 153 lb (69.4 kg)    Gen: alert, oriented x 4, affect pleasant.  Lucid thinking and conversation noted. HEENT: PERRLA, EOMI.   Neck: no LAD, mass, or thyromegaly. CV: RRR, no m/r/g LUNGS: CTA bilat, nonlabored. NEURO: no tremor or tics noted on observation.  Coordination intact. CN 2-12 grossly intact bilaterally, strength 5/5 in all extremeties.  No ataxia. SKIN: diffuse oval macules with superficial flaking on trunk and arms--avg size is 2-3 mm. No hives.  LABS:  none  IMPRESSION AND PLAN:  1) Adult ADD, anxiety and depression-chronic.  Doing well.  The current medical regimen is effective;  continue present plan and medications. I printed rx's for dexedrine ER 10 mg, 2 tabs po qAM, #60 today for this month, Sept, and Oct 2016.  Appropriate fill on/after date was noted on each rx.  2) Ascariasis: clearing up nicely.  I think it is reasonable to give him a second dosing of abendazole  so I rx'd this today.  An After Visit Summary was printed and given to the patient.  FOLLOW UP: Return in about 4 months (around 09/04/2015).

## 2015-08-01 ENCOUNTER — Ambulatory Visit (INDEPENDENT_AMBULATORY_CARE_PROVIDER_SITE_OTHER): Payer: Commercial Managed Care - HMO | Admitting: Family Medicine

## 2015-08-01 ENCOUNTER — Encounter: Payer: Self-pay | Admitting: Family Medicine

## 2015-08-01 VITALS — BP 144/90 | HR 60 | Temp 98.9°F | Resp 16 | Ht 72.0 in | Wt 167.0 lb

## 2015-08-01 DIAGNOSIS — F902 Attention-deficit hyperactivity disorder, combined type: Secondary | ICD-10-CM | POA: Diagnosis not present

## 2015-08-01 DIAGNOSIS — F329 Major depressive disorder, single episode, unspecified: Secondary | ICD-10-CM

## 2015-08-01 DIAGNOSIS — F32A Depression, unspecified: Secondary | ICD-10-CM

## 2015-08-01 MED ORDER — DEXTROAMPHETAMINE SULFATE ER 15 MG PO CP24: ORAL_CAPSULE | ORAL | Status: DC

## 2015-08-01 NOTE — Progress Notes (Signed)
OFFICE VISIT  08/01/2015   CC:  Chief Complaint  Patient presents with  . Follow-up    Pt is fasting.      HPI:    Patient is a 24 y.o. Caucasian male who presents for f/u ADHD.   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  Mood is stable. Asks to go up on dexedrine dosing to 30mg  if possible.  No adverse side effects from dexedrine at this time.    Past Medical History  Diagnosis Date  . Anxiety and depression   . ADHD (attention deficit hyperactivity disorder)   . History of shingles   . 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 Mack Hookavid Thompson with [facility] plans to do surgery as of 10/01/13.  . Tobacco dependence   . H/O ascariasis 03/2015    with associated hives    Past Surgical History  Procedure Laterality Date  . No past surgeries      Outpatient Prescriptions Prior to Visit  Medication Sig Dispense Refill  . PARoxetine (PAXIL) 20 MG tablet Take 1 tablet (20 mg total) by mouth daily. 30 tablet 11  . dextroamphetamine (DEXEDRINE SPANSULE) 10 MG 24 hr capsule 2 caps po qAM 60 capsule 0  . albendazole (ALBENZA) 200 MG tablet Take 2 tablets (400 mg total) by mouth once. (Patient not taking: Reported on 08/01/2015) 2 tablet 0  . cetirizine (ZYRTEC) 10 MG tablet Take 1 tablet (10 mg total) by mouth daily. (Patient not taking: Reported on 08/01/2015) 30 tablet 11   No facility-administered medications prior to visit.    Allergies  Allergen Reactions  . Nasonex [Mometasone] Other (See Comments)    Nasal irritation/nosebleeds    ROS As per HPI  PE: Blood pressure 144/90, pulse 60, temperature 98.9 F (37.2 C), temperature source Oral, resp. rate 16, height 6' (1.829 m), weight 167 lb (75.751 kg), SpO2 97 %. Wt Readings from Last 2 Encounters:  08/01/15 167 lb (75.751 kg)  05/05/15 161 lb 12.8 oz (73.392 kg)     Gen: alert, oriented x 4, affect pleasant.  Lucid thinking and conversation noted. HEENT: PERRLA, EOMI.   Neck: no LAD, mass, or thyromegaly. CV: RRR, no m/r/g LUNGS: CTA bilat, nonlabored. NEURO: no tremor or tics noted on observation.  Coordination intact. CN 2-12 grossly intact bilaterally, strength 5/5 in all extremeties.  No ataxia.   LABS:  none  IMPRESSION AND PLAN:  1) Adult ADHD; will go ahead and increase dosing to TWO of the 15 mg extended release dexedrine caps per morning.  Therapeutic expectations and side effect profile of medication discussed today.  Patient's questions answered.  2) Hx of depression: stable on current med/dosing.  No changes today.  An After Visit Summary was printed and given to the patient.  FOLLOW UP: Return in about 3 months (around 11/01/2015) for routine chronic illness f/u.

## 2015-08-01 NOTE — Progress Notes (Signed)
Pre visit review using our clinic review tool, if applicable. No additional management support is needed unless otherwise documented below in the visit note. 

## 2015-10-26 ENCOUNTER — Ambulatory Visit: Payer: Commercial Managed Care - HMO | Admitting: Family Medicine

## 2015-10-27 ENCOUNTER — Encounter: Payer: Self-pay | Admitting: Family Medicine

## 2015-10-27 ENCOUNTER — Ambulatory Visit (INDEPENDENT_AMBULATORY_CARE_PROVIDER_SITE_OTHER): Payer: Commercial Managed Care - HMO | Admitting: Family Medicine

## 2015-10-27 VITALS — BP 108/74 | HR 92 | Temp 98.4°F | Resp 16 | Ht 72.0 in | Wt 161.0 lb

## 2015-10-27 DIAGNOSIS — Z8659 Personal history of other mental and behavioral disorders: Secondary | ICD-10-CM | POA: Diagnosis not present

## 2015-10-27 DIAGNOSIS — F909 Attention-deficit hyperactivity disorder, unspecified type: Secondary | ICD-10-CM

## 2015-10-27 DIAGNOSIS — F900 Attention-deficit hyperactivity disorder, predominantly inattentive type: Secondary | ICD-10-CM | POA: Diagnosis not present

## 2015-10-27 MED ORDER — DEXTROAMPHETAMINE SULFATE ER 15 MG PO CP24: ORAL_CAPSULE | ORAL | Status: DC

## 2015-10-27 NOTE — Progress Notes (Signed)
OFFICE NOTE  10/27/2015  CC:  Chief Complaint  Patient presents with  . Follow-up     HPI: Patient is a 25 y.o. Caucasian male who is here for 3 mo f/u ADHD and hx of depression. Dexedrine  working well for focus/concentration/motivation, w/out adverse side effect.  He does not his HR is a bit higher on this dosing but also says he drinks a lot of strong coffee 24/7.  We talked about cutting back on caffeine intake today Denies palpitations, cP, SOB, or dizziness. Mood is stable, compliant with paxil.    Pertinent PMH:  Past medical, surgical, social, and family history reviewed and no changes are noted since last office visit.  MEDS:  Outpatient Prescriptions Prior to Visit  Medication Sig Dispense Refill  . dextroamphetamine (DEXEDRINE) 15 MG 24 hr capsule 2 caps po qAM 60 capsule 0  . PARoxetine (PAXIL) 20 MG tablet Take 1 tablet (20 mg total) by mouth daily. 30 tablet 11   No facility-administered medications prior to visit.    PE: Blood pressure 108/74, pulse 92, temperature 98.4 F (36.9 C), temperature source Oral, resp. rate 16, height 6' (1.829 m), weight 161 lb (73.029 kg), SpO2 99 %. Wt Readings from Last 2 Encounters:  10/27/15 161 lb (73.029 kg)  08/01/15 167 lb (75.751 kg)    Gen: alert, oriented x 4, affect pleasant.  Lucid thinking and conversation noted. HEENT: PERRLA, EOMI.   Neck: no LAD, mass, or thyromegaly. CV: RRR, no m/r/g LUNGS: CTA bilat, nonlabored. NEURO: no tremor or tics noted on observation.  Coordination intact. CN 2-12 grossly intact bilaterally, strength 5/5 in all extremeties.  No ataxia.  IMPRESSION AND PLAN:  Adult ADHD: The current medical regimen is effective;  continue present plan and medications. I printed rx's for dexedrine  spanules, 2 qAM, #60 today for this month, March 2017, and April 2017.  Appropriate fill on/after date was noted on each rx.   Encouraged pt to cut back slowly on caffeine intake.  Hx of  depression: stable on maintenance paxil.  An After Visit Summary was printed and given to the patient.  FOLLOW UP: 3 mo

## 2015-10-27 NOTE — Progress Notes (Signed)
Pre visit review using our clinic review tool, if applicable. No additional management support is needed unless otherwise documented below in the visit note. 

## 2015-10-27 NOTE — Addendum Note (Signed)
Addended by: Jeoffrey Massed on: 10/27/2015 03:59 PM   Modules accepted: Orders

## 2016-01-18 ENCOUNTER — Encounter: Payer: Self-pay | Admitting: Family Medicine

## 2016-01-18 ENCOUNTER — Ambulatory Visit (INDEPENDENT_AMBULATORY_CARE_PROVIDER_SITE_OTHER): Payer: Commercial Managed Care - HMO | Admitting: Family Medicine

## 2016-01-18 VITALS — BP 114/76 | HR 75 | Temp 98.4°F | Resp 16 | Ht 72.0 in | Wt 171.0 lb

## 2016-01-18 DIAGNOSIS — F900 Attention-deficit hyperactivity disorder, predominantly inattentive type: Secondary | ICD-10-CM

## 2016-01-18 DIAGNOSIS — F329 Major depressive disorder, single episode, unspecified: Secondary | ICD-10-CM | POA: Diagnosis not present

## 2016-01-18 DIAGNOSIS — S90812A Abrasion, left foot, initial encounter: Secondary | ICD-10-CM

## 2016-01-18 DIAGNOSIS — F32A Depression, unspecified: Secondary | ICD-10-CM

## 2016-01-18 DIAGNOSIS — F909 Attention-deficit hyperactivity disorder, unspecified type: Secondary | ICD-10-CM

## 2016-01-18 MED ORDER — MUPIROCIN 2 % EX OINT: TOPICAL_OINTMENT | CUTANEOUS | Status: DC

## 2016-01-18 MED ORDER — DEXTROAMPHETAMINE SULFATE ER 15 MG PO CP24: ORAL_CAPSULE | ORAL | Status: DC

## 2016-01-18 MED ORDER — PAROXETINE HCL 20 MG PO TABS: 20.0000 mg | ORAL_TABLET | Freq: Every day | ORAL | Status: DC

## 2016-01-18 MED ORDER — FLUTICASONE PROPIONATE 0.05 % EX CREA: TOPICAL_CREAM | Freq: Two times a day (BID) | CUTANEOUS | Status: DC

## 2016-01-18 NOTE — Progress Notes (Signed)
Pre visit review using our clinic review tool, if applicable. No additional management support is needed unless otherwise documented below in the visit note. 

## 2016-01-18 NOTE — Progress Notes (Signed)
OFFICE VISIT  01/18/2016   CC:  Chief Complaint  Patient presents with  . Follow-up  . Abrasion    left foot 4 days ago     HPI:    Patient is a 25 y.o. Caucasian male who presents for 3 mo f/u adult ADD and hx of depression--in remission. Patient states he is feeling well.  Dexedrine helps focus well, w/out side effects. Paxil helps mood stay very stable, no side effects.  He has recently scraped the top of his left foot with some glass at work--4 days ago.  It had some swelling and slight exudate on one area until yesterday per pt report, but he says it stopped today and looks much better.  No fevers.  No significant pain.  He is not applying anything now but at first he applied a steroid cream a few times b/c it itched.  Past Medical History  Diagnosis Date  . Anxiety and depression   . ADHD (attention deficit hyperactivity disorder)   . History of shingles   . 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 Mack Hookavid Thompson with [facility] plans to do surgery as of 10/01/13.  . Tobacco dependence   . H/O ascariasis 03/2015    with associated hives    Past Surgical History  Procedure Laterality Date  . No past surgeries      Outpatient Prescriptions Prior to Visit  Medication Sig Dispense Refill  . dextroamphetamine (DEXEDRINE) 15 MG 24 hr capsule 2 caps po qAM 60 capsule 0  . PARoxetine (PAXIL) 20 MG tablet Take 1 tablet (20 mg total) by mouth daily. 30 tablet 11   No facility-administered medications prior to visit.    Allergies  Allergen Reactions  . Nasonex [Mometasone] Other (See Comments)    Nasal irritation/nosebleeds    ROS As per HPI  PE: Blood pressure 114/76, pulse 75, temperature 98.4 F (36.9 C), temperature source Oral, resp. rate 16, height 6' (1.829 m), weight 171 lb (77.565 kg), SpO2 96 %. Wt Readings from Last 2 Encounters:  01/18/16 171 lb (77.565 kg)  10/27/15 161 lb (73.029 kg)    Gen: alert, oriented x  4, affect pleasant.  Lucid thinking and conversation noted. HEENT: PERRLA, EOMI.   Neck: no LAD, mass, or thyromegaly. CV: RRR, no m/r/g LUNGS: CTA bilat, nonlabored. NEURO: no tremor or tics noted on observation.  Coordination intact. CN 2-12 grossly intact bilaterally, strength 5/5 in all extremeties.  No ataxia. Left foot: dorsum has a few small scattered abrasions with scabs, and there are only a couple of small areas of moist superficial ulceration/abrasion.  No active exudate.  No streaking.  No swelling.  No signif tenderness.  No foul odor.  LABS:  none  IMPRESSION AND PLAN:  1) Adult ADD: The current medical regimen is effective;  continue present plan and medications. I printed rx's for dexedrine 15mg , 2 caps qAM, #60 today for this month, May, and June 2017.  Appropriate fill on/after date was noted on each rx.  2) Depression, in remission: The current medical regimen is effective;  continue present plan and medications. RF'd paxil today.  3) Left foot abrasion: no significant sign of active infection but I rx'd bactroban ointment for him to apply to the open abrasions.  An After Visit Summary was printed and given to the patient.  FOLLOW UP: Return in about 3 months (around 04/18/2016) for f/u ADD/MOOD.  Signed:  Santiago BumpersPhil Elizah Lydon, MD  01/18/2016      

## 2016-02-06 ENCOUNTER — Ambulatory Visit (INDEPENDENT_AMBULATORY_CARE_PROVIDER_SITE_OTHER): Payer: Commercial Managed Care - HMO | Admitting: Family Medicine

## 2016-02-06 ENCOUNTER — Encounter: Payer: Self-pay | Admitting: Family Medicine

## 2016-02-06 VITALS — BP 126/85 | HR 82 | Temp 97.9°F | Resp 16 | Ht 72.0 in | Wt 163.8 lb

## 2016-02-06 DIAGNOSIS — B882 Other arthropod infestations: Secondary | ICD-10-CM

## 2016-02-06 MED ORDER — DOXYCYCLINE HYCLATE 100 MG PO TABS: 100.0000 mg | ORAL_TABLET | Freq: Two times a day (BID) | ORAL | Status: DC

## 2016-02-06 NOTE — Progress Notes (Signed)
Pre visit review using our clinic review tool, if applicable. No additional management support is needed unless otherwise documented below in the visit note. 

## 2016-02-06 NOTE — Progress Notes (Signed)
OFFICE NOTE  02/06/2016  CC:  Chief Complaint  Patient presents with  . Insect Bite    pt removed a tick 1 month ago   HPI: Patient is a 25 y.o. Caucasian male who is here for suspicion of tick born illness. Onset 2 wks ago: fatigue, HA, intermittent subjective fevers, soreness over lots of muscles, some little bumpy rash on shoulders. 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  No URI sx's or ST.  No n/v/d.    Pertinent PMH:  Past medical, surgical, social, and family history reviewed and no changes are noted since last office visit.  MEDS:  Outpatient Prescriptions Prior to Visit  Medication Sig Dispense Refill  . dextroamphetamine (DEXEDRINE) 15 MG 24 hr capsule 2 caps po qAM 60 capsule 0  . fluticasone (CUTIVATE) 0.05 % cream Apply topically 2 (two) times daily. 30 g 1  . PARoxetine (PAXIL) 20 MG tablet Take 1 tablet (20 mg total) by mouth daily. 30 tablet 11  . mupirocin ointment (BACTROBAN) 2 % Apply to affected area of foot bid x 10 days (Patient not taking: Reported on 02/06/2016) 15 g 1   No facility-administered medications prior to visit.    PE: Blood pressure 126/85, pulse 82, temperature 97.9 F (36.6 C), temperature source Oral, resp. rate 16, height 6' (1.829 m), weight 163 lb 12 oz (74.277 kg), SpO2 100 %. Gen: Alert, well appearing.  Patient is oriented to person, place, time, and situation. ENT: Ears: EACs clear, normal epithelium.  TMs with good light reflex and landmarks bilaterally.  Eyes: no injection, icteris, swelling, or exudate.  EOMI, PERRLA. Nose: no drainage or turbinate edema/swelling.  No injection or focal lesion.  Mouth: lips without lesion/swelling.  Oral mucosa pink and moist.  Dentition intact and without obvious caries or gingival swelling.  Oropharynx without erythema, exudate, or swelling.  Neck - No masses or thyromegaly or limitation in range of motion CV: RRR, no m/r/g.   LUNGS: CTA bilat, nonlabored resps, good aeration in all lung  fields. EXT: no clubbing, cyanosis, or edema.  SKIN: a few scattered insect bites.  Tops of both shoulders with nondescript scattered small papules that are flesh colored. Musculoskeletal: muscles just a bit tender to squeeze in arms/legs.  IMPRESSION AND PLAN:  Tick born illness possible. Start doxycycline 100mg  bid x 10d. Apply cutivate 0.05% cream to insect/tick bites.  None of the bites look infected.  An After Visit Summary was printed and given to the patient.   FOLLOW UP: prn  Signed:  Santiago BumpersPhil Dessire Grimes, MD           02/06/2016

## 2016-04-11 ENCOUNTER — Encounter: Payer: Self-pay | Admitting: Family Medicine

## 2016-04-11 ENCOUNTER — Ambulatory Visit (INDEPENDENT_AMBULATORY_CARE_PROVIDER_SITE_OTHER): Payer: Commercial Managed Care - HMO | Admitting: Family Medicine

## 2016-04-11 VITALS — BP 143/85 | HR 111 | Temp 98.8°F | Resp 16 | Ht 72.0 in | Wt 173.5 lb

## 2016-04-11 DIAGNOSIS — R030 Elevated blood-pressure reading, without diagnosis of hypertension: Secondary | ICD-10-CM | POA: Diagnosis not present

## 2016-04-11 DIAGNOSIS — F329 Major depressive disorder, single episode, unspecified: Secondary | ICD-10-CM | POA: Diagnosis not present

## 2016-04-11 DIAGNOSIS — F411 Generalized anxiety disorder: Secondary | ICD-10-CM

## 2016-04-11 DIAGNOSIS — F32A Depression, unspecified: Secondary | ICD-10-CM

## 2016-04-11 DIAGNOSIS — F902 Attention-deficit hyperactivity disorder, combined type: Secondary | ICD-10-CM

## 2016-04-11 MED ORDER — DEXTROAMPHETAMINE SULFATE ER 15 MG PO CP24: ORAL_CAPSULE | ORAL | Status: DC

## 2016-04-11 NOTE — Progress Notes (Signed)
Pre visit review using our clinic review tool, if applicable. No additional management support is needed unless otherwise documented below in the visit note. 

## 2016-04-11 NOTE — Progress Notes (Signed)
OFFICE VISIT  04/11/2016   CC:  Chief Complaint  Patient presents with  . Follow-up     HPI:    Patient is a 25 y.o. Caucasian male who presents for f/u ADHD and anxiety/depression. Drinking a lot of caffeine lately--it is making his HR and bp go up.   He drinks 6 large glasses of tea a day.   Denies palpitations, SOB, or CP.  Doing well as far as focus, concentration, motivation on dexedrine at current dosing. Mood and anxiety is stable on paxil 20mg  qd. He continues to smoke cigarettes.   Past Medical History  Diagnosis Date  . Anxiety and depression   . ADHD (attention deficit hyperactivity disorder)   . History of shingles   . 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 Mack Hookavid Thompson with [facility] plans to do surgery as of 10/01/13.  . Tobacco dependence   . H/O ascariasis 03/2015    with associated hives    Past Surgical History  Procedure Laterality Date  . No past surgeries      Outpatient Prescriptions Prior to Visit  Medication Sig Dispense Refill  . fluticasone (CUTIVATE) 0.05 % cream Apply topically 2 (two) times daily. 30 g 1  . PARoxetine (PAXIL) 20 MG tablet Take 1 tablet (20 mg total) by mouth daily. 30 tablet 11  . dextroamphetamine (DEXEDRINE) 15 MG 24 hr capsule 2 caps po qAM 60 capsule 0  . doxycycline (VIBRA-TABS) 100 MG tablet Take 1 tablet (100 mg total) by mouth 2 (two) times daily. (Patient not taking: Reported on 04/11/2016) 20 tablet 0   No facility-administered medications prior to visit.    Allergies  Allergen Reactions  . Nasonex [Mometasone] Other (See Comments)    Nasal irritation/nosebleeds    ROS As per HPI  PE: Blood pressure 143/85, pulse 111, temperature 98.8 F (37.1 C), temperature source Oral, resp. rate 16, height 6' (1.829 m), weight 173 lb 8 oz (78.699 kg), SpO2 98 %. Wt Readings from Last 2 Encounters:  04/11/16 173 lb 8 oz (78.699 kg)  02/06/16 163 lb 12 oz (74.277 kg)     Gen: alert, oriented x 4, affect pleasant.  Lucid thinking and conversation noted. HEENT: PERRLA, EOMI.   Neck: no LAD, mass, or thyromegaly. CV: RRR, no m/r/g LUNGS: CTA bilat, nonlabored. NEURO: no tremor or tics noted on observation.  Coordination intact. CN 2-12 grossly intact bilaterally, strength 5/5 in all extremeties.  No ataxia.   LABS:  none  IMPRESSION AND PLAN:  1) ADHD: The current medical regimen is effective;  continue present plan and medications. I printed rx's for dexedrine ER 15mg , 2 caps qAM, #60 today for this month, August 2017, and Sept 2017.  Appropriate fill on/after date was noted on each rx.  2) Anxiety and depression: The current medical regimen is effective;  continue present plan and medications.  3) Elevated bp w/out dx of HTN: suspect excessive caffeine + cigs+dexedrine. Encouraged pt to slowly cut back on caffeine intake, restart his exercise habits, and stop smoking. He is not yet committed to smoking cessation trial.  An After Visit Summary was printed and given to the patient.  FOLLOW UP: Return in about 3 months (around 07/12/2016) for f/u ADHD/med.  Signed:  Santiago BumpersPhil Agripina Guyette, MD           04/11/2016

## 2016-05-15 ENCOUNTER — Encounter (HOSPITAL_COMMUNITY): Payer: Self-pay | Admitting: Emergency Medicine

## 2016-05-15 ENCOUNTER — Emergency Department (HOSPITAL_COMMUNITY)
Admission: EM | Admit: 2016-05-15 | Discharge: 2016-05-16 | Disposition: A | Discharge status: Other Acute Inpatient Hospital | Payer: Commercial Managed Care - HMO | Attending: Emergency Medicine | Admitting: Emergency Medicine

## 2016-05-15 DIAGNOSIS — S61512A Laceration without foreign body of left wrist, initial encounter: Secondary | ICD-10-CM | POA: Insufficient documentation

## 2016-05-15 DIAGNOSIS — Y939 Activity, unspecified: Secondary | ICD-10-CM | POA: Insufficient documentation

## 2016-05-15 DIAGNOSIS — Y999 Unspecified external cause status: Secondary | ICD-10-CM | POA: Diagnosis not present

## 2016-05-15 DIAGNOSIS — S61511A Laceration without foreign body of right wrist, initial encounter: Secondary | ICD-10-CM | POA: Insufficient documentation

## 2016-05-15 DIAGNOSIS — X789XXA Intentional self-harm by unspecified sharp object, initial encounter: Secondary | ICD-10-CM | POA: Insufficient documentation

## 2016-05-15 DIAGNOSIS — R45851 Suicidal ideations: Secondary | ICD-10-CM | POA: Diagnosis not present

## 2016-05-15 DIAGNOSIS — S6991XA Unspecified injury of right wrist, hand and finger(s), initial encounter: Secondary | ICD-10-CM | POA: Diagnosis present

## 2016-05-15 DIAGNOSIS — Y929 Unspecified place or not applicable: Secondary | ICD-10-CM | POA: Insufficient documentation

## 2016-05-15 DIAGNOSIS — R4589 Other symptoms and signs involving emotional state: Secondary | ICD-10-CM

## 2016-05-15 DIAGNOSIS — F909 Attention-deficit hyperactivity disorder, unspecified type: Secondary | ICD-10-CM | POA: Insufficient documentation

## 2016-05-15 DIAGNOSIS — F17210 Nicotine dependence, cigarettes, uncomplicated: Secondary | ICD-10-CM | POA: Diagnosis not present

## 2016-05-15 DIAGNOSIS — Z79899 Other long term (current) drug therapy: Secondary | ICD-10-CM | POA: Insufficient documentation

## 2016-05-15 DIAGNOSIS — R4689 Other symptoms and signs involving appearance and behavior: Secondary | ICD-10-CM

## 2016-05-15 LAB — CBC
HCT: 44.3 % (ref 39.0–52.0)
Hemoglobin: 15.2 g/dL (ref 13.0–17.0)
MCH: 30.2 pg (ref 26.0–34.0)
MCHC: 34.3 g/dL (ref 30.0–36.0)
MCV: 87.9 fL (ref 78.0–100.0)
Platelets: 252 10*3/uL (ref 150–400)
RBC: 5.04 MIL/uL (ref 4.22–5.81)
RDW: 13.1 % (ref 11.5–15.5)
WBC: 10.9 10*3/uL — ABNORMAL HIGH (ref 4.0–10.5)

## 2016-05-15 LAB — RAPID URINE DRUG SCREEN, HOSP PERFORMED
Amphetamines: POSITIVE — AB
BARBITURATES: NOT DETECTED
Benzodiazepines: NOT DETECTED
COCAINE: NOT DETECTED
OPIATES: NOT DETECTED
TETRAHYDROCANNABINOL: NOT DETECTED

## 2016-05-15 LAB — COMPREHENSIVE METABOLIC PANEL
ALK PHOS: 68 U/L (ref 38–126)
ALT: 26 U/L (ref 17–63)
ANION GAP: 7 (ref 5–15)
AST: 28 U/L (ref 15–41)
Albumin: 5.1 g/dL — ABNORMAL HIGH (ref 3.5–5.0)
BILIRUBIN TOTAL: 0.6 mg/dL (ref 0.3–1.2)
BUN: 12 mg/dL (ref 6–20)
CALCIUM: 8.6 mg/dL — AB (ref 8.9–10.3)
CO2: 23 mmol/L (ref 22–32)
CREATININE: 1.04 mg/dL (ref 0.61–1.24)
Chloride: 106 mmol/L (ref 101–111)
Glucose, Bld: 94 mg/dL (ref 65–99)
Potassium: 4.2 mmol/L (ref 3.5–5.1)
Sodium: 136 mmol/L (ref 135–145)
TOTAL PROTEIN: 8.6 g/dL — AB (ref 6.5–8.1)

## 2016-05-15 LAB — SALICYLATE LEVEL

## 2016-05-15 LAB — ETHANOL: Alcohol, Ethyl (B): 122 mg/dL — ABNORMAL HIGH (ref <5)

## 2016-05-15 LAB — ACETAMINOPHEN LEVEL

## 2016-05-15 MED ORDER — TETANUS-DIPHTH-ACELL PERTUSSIS 5-2.5-18.5 LF-MCG/0.5 IM SUSP
0.5000 mL | Freq: Once | INTRAMUSCULAR | Status: AC
  Administered 2016-05-15: 0.5 mL via INTRAMUSCULAR
  Filled 2016-05-15: qty 0.5

## 2016-05-15 MED ORDER — PAROXETINE HCL 20 MG PO TABS
ORAL_TABLET | ORAL | Status: AC
  Filled 2016-05-15: qty 1

## 2016-05-15 MED ORDER — PAROXETINE HCL 20 MG PO TABS
20.0000 mg | ORAL_TABLET | Freq: Every day | ORAL | Status: DC
  Administered 2016-05-15 – 2016-05-16 (×2): 20 mg via ORAL
  Filled 2016-05-15 (×3): qty 1

## 2016-05-15 NOTE — ED Notes (Signed)
[facility]. She can be reached at [phone] Felecia Jan(Susan Chandler).

## 2016-05-15 NOTE — ED Notes (Addendum)
Patient states he cannot void at this time. Water given.

## 2016-05-15 NOTE — ED Notes (Signed)
Awaiting [facility] assessment 

## 2016-05-15 NOTE — ED Triage Notes (Signed)
Pt brought in by [facility] under IVC. Pt reportedly cut both of his wrists in front of his mom. Pt states he did it just get her attention. States that he does not want to kill himself or anybody else. Pt came in with shackles to hands and legs. Pt has blood all over face, torso, and pants. No active bleeding to wrists noted at this time. Superficial cuts noted. Pt states he cut it with a utility knife.

## 2016-05-16 ENCOUNTER — Inpatient Hospital Stay (HOSPITAL_COMMUNITY)
Admission: AD | Admit: 2016-05-16 | Discharge: 2016-05-20 | DRG: 897 | Disposition: A | Discharge status: Home / Self Care | Payer: 59 | Attending: Psychiatry | Admitting: Psychiatry

## 2016-05-16 ENCOUNTER — Encounter (HOSPITAL_COMMUNITY): Payer: Self-pay | Admitting: *Deleted

## 2016-05-16 DIAGNOSIS — F909 Attention-deficit hyperactivity disorder, unspecified type: Secondary | ICD-10-CM | POA: Diagnosis present

## 2016-05-16 DIAGNOSIS — Z8619 Personal history of other infectious and parasitic diseases: Secondary | ICD-10-CM | POA: Diagnosis not present

## 2016-05-16 DIAGNOSIS — Z833 Family history of diabetes mellitus: Secondary | ICD-10-CM | POA: Diagnosis not present

## 2016-05-16 DIAGNOSIS — F1994 Other psychoactive substance use, unspecified with psychoactive substance-induced mood disorder: Secondary | ICD-10-CM

## 2016-05-16 DIAGNOSIS — F1024 Alcohol dependence with alcohol-induced mood disorder: Principal | ICD-10-CM | POA: Diagnosis present

## 2016-05-16 DIAGNOSIS — F1094 Alcohol use, unspecified with alcohol-induced mood disorder: Secondary | ICD-10-CM | POA: Diagnosis not present

## 2016-05-16 DIAGNOSIS — F331 Major depressive disorder, recurrent, moderate: Secondary | ICD-10-CM | POA: Diagnosis present

## 2016-05-16 DIAGNOSIS — F411 Generalized anxiety disorder: Secondary | ICD-10-CM | POA: Diagnosis present

## 2016-05-16 DIAGNOSIS — Z8249 Family history of ischemic heart disease and other diseases of the circulatory system: Secondary | ICD-10-CM

## 2016-05-16 DIAGNOSIS — Z791 Long term (current) use of non-steroidal anti-inflammatories (NSAID): Secondary | ICD-10-CM | POA: Diagnosis not present

## 2016-05-16 DIAGNOSIS — F17210 Nicotine dependence, cigarettes, uncomplicated: Secondary | ICD-10-CM | POA: Diagnosis present

## 2016-05-16 DIAGNOSIS — Z818 Family history of other mental and behavioral disorders: Secondary | ICD-10-CM | POA: Diagnosis not present

## 2016-05-16 DIAGNOSIS — S61511A Laceration without foreign body of right wrist, initial encounter: Secondary | ICD-10-CM | POA: Diagnosis not present

## 2016-05-16 MED ORDER — CHLORDIAZEPOXIDE HCL 25 MG PO CAPS
25.0000 mg | ORAL_CAPSULE | Freq: Four times a day (QID) | ORAL | Status: DC
  Filled 2016-05-16 (×2): qty 1

## 2016-05-16 MED ORDER — CHLORDIAZEPOXIDE HCL 25 MG PO CAPS: 25.0000 mg | ORAL_CAPSULE | Freq: Four times a day (QID) | ORAL | Status: DC | PRN

## 2016-05-16 MED ORDER — TRAZODONE HCL 50 MG PO TABS
50.0000 mg | ORAL_TABLET | Freq: Every evening | ORAL | Status: DC | PRN
  Filled 2016-05-16 (×2): qty 1

## 2016-05-16 MED ORDER — ADULT MULTIVITAMIN W/MINERALS CH
1.0000 | ORAL_TABLET | Freq: Every day | ORAL | Status: DC
  Administered 2016-05-17 – 2016-05-20 (×4): 1 via ORAL
  Filled 2016-05-16 (×7): qty 1

## 2016-05-16 MED ORDER — CHLORDIAZEPOXIDE HCL 25 MG PO CAPS: 25.0000 mg | ORAL_CAPSULE | ORAL | Status: DC

## 2016-05-16 MED ORDER — PAROXETINE HCL 20 MG PO TABS
20.0000 mg | ORAL_TABLET | Freq: Every day | ORAL | Status: DC
  Administered 2016-05-17 – 2016-05-20 (×4): 20 mg via ORAL
  Filled 2016-05-16 (×7): qty 1

## 2016-05-16 MED ORDER — HYDROXYZINE HCL 25 MG PO TABS
25.0000 mg | ORAL_TABLET | ORAL | Status: DC | PRN
  Administered 2016-05-16: 25 mg via ORAL
  Filled 2016-05-16: qty 1

## 2016-05-16 MED ORDER — THIAMINE HCL 100 MG/ML IJ SOLN: 100.0000 mg | Freq: Once | INTRAMUSCULAR | Status: DC

## 2016-05-16 MED ORDER — ACETAMINOPHEN 325 MG PO TABS: 650.0000 mg | ORAL_TABLET | Freq: Four times a day (QID) | ORAL | Status: DC | PRN

## 2016-05-16 MED ORDER — NICOTINE 21 MG/24HR TD PT24
21.0000 mg | MEDICATED_PATCH | Freq: Every day | TRANSDERMAL | Status: DC
  Filled 2016-05-16 (×5): qty 1

## 2016-05-16 MED ORDER — LOPERAMIDE HCL 2 MG PO CAPS: 2.0000 mg | ORAL_CAPSULE | ORAL | Status: AC | PRN

## 2016-05-16 MED ORDER — CHLORDIAZEPOXIDE HCL 25 MG PO CAPS: 25.0000 mg | ORAL_CAPSULE | Freq: Three times a day (TID) | ORAL | Status: DC

## 2016-05-16 MED ORDER — VITAMIN B-1 100 MG PO TABS
100.0000 mg | ORAL_TABLET | Freq: Every day | ORAL | Status: DC
  Administered 2016-05-18: 100 mg via ORAL
  Filled 2016-05-16 (×7): qty 1

## 2016-05-16 MED ORDER — CALAMINE EX LOTN
TOPICAL_LOTION | Freq: Four times a day (QID) | CUTANEOUS | Status: DC | PRN
  Administered 2016-05-18: 1 via TOPICAL
  Filled 2016-05-16: qty 118

## 2016-05-16 MED ORDER — CHLORDIAZEPOXIDE HCL 25 MG PO CAPS: 25.0000 mg | ORAL_CAPSULE | Freq: Every day | ORAL | Status: DC

## 2016-05-16 MED ORDER — ALUM & MAG HYDROXIDE-SIMETH 200-200-20 MG/5ML PO SUSP
30.0000 mL | ORAL | Status: DC | PRN
Start: 2016-05-16 — End: 2016-05-20

## 2016-05-16 MED ORDER — HYDROXYZINE HCL 25 MG PO TABS
25.0000 mg | ORAL_TABLET | ORAL | Status: DC | PRN
  Administered 2016-05-17 – 2016-05-19 (×4): 25 mg via ORAL
  Filled 2016-05-16 (×6): qty 1

## 2016-05-16 MED ORDER — MAGNESIUM HYDROXIDE 400 MG/5ML PO SUSP: 30.0000 mL | Freq: Every day | ORAL | Status: DC | PRN

## 2016-05-16 MED ORDER — ONDANSETRON 4 MG PO TBDP: 4.0000 mg | ORAL_TABLET | Freq: Four times a day (QID) | ORAL | Status: AC | PRN

## 2016-05-16 NOTE — [facility] Counselor (Signed)
[facility] Assessment Progress Note  Writer rec'd call from pt's mother, Felecia JanSusan Chandler [phone]), who IVCd pt to give collateral information. This is what Ms. Chandler reported (paraphased):  He was living with me for 2 nights b/c he was working. [place] He found a stash of alcohol in the house-the reason why he volunteered to mow the yard. He was drunk and was beyond belligerent and verbally abusive to me. I let him spend the night in the house b/c he threatened to sleep in the woods, and I went back to my home. An hour after I left, he starts a barrage of texts and calls to me being belligerent and hateful. In the morning, I go back to the house and find that he has destroyed the inside of the house-graffiti and broken furniture. I find him in an embankment asleep. I wake him up and he's still being belligerent and verbally abusive to me. I drive away and he starts running after the car and says, "I'm gonna cut my wrists right here!" and makes gestures to do so. I come back and checked him and saw nothing on his wrists. I went home and he texts me saying, "I can't believe you would leave you son alone when he's slitting his wrists". While on the way over back there, his ex-girlfriend calls me and tells me that he texted his ex-girlfriend that he's going to kill himself. I get to the house and there's blood smeared everywhere. I called 911 for help.  As background info, pt lived with ex-girlfriend last year and she said that he slit both of his wrists at that time, but she never called to let anyone know. He was diagnosed with Bipolar as a teenager, but doesn't take any meds.   Johny ShockSamantha M. Ladona Ridgelaylor, MS, NCC, LPCA Counselor

## 2016-05-16 NOTE — ED Notes (Signed)
Pt requesting something for chiggers.  Dr. Fayrene FearingJames notified.  Orders being placed.

## 2016-05-16 NOTE — ED Notes (Signed)
[facility] pt to be held until morning when he will be reevaluated

## 2016-05-16 NOTE — ED Notes (Signed)
Cleaned lacerations to both wrist with surclens and applied clean dry dressings.

## 2016-05-16 NOTE — ED Notes (Signed)
TTS in progress 

## 2016-05-16 NOTE — ED Provider Notes (Signed)
Patient has been medically cleared by previous provider. Was given some Atarax for some bug bites. Otherwise has remained stable. [facility]   Rolland PorterMark Money Mckeithan, MD 05/16/16 1311

## 2016-05-16 NOTE — BH Assessment (Addendum)
Tele Assessment Note   Nicholas Wu is an 25 y.o. male presenting involuntarily (petitioned by mother) for assessment. Per IVC petition:  The petitioner said yesterday the respondent was drinking alcohol. This morning the respondent cut his wrist and ran in the woods. The respondent is a danger to himself and needs to be examined. ------  Pt reports beinig present in the ED due to "ongoing issues" with his mother. Pt states "I'd been drinking and decided to get her attention by slitting my wrists. Probably not the best idea but, I don't make the best decisions when I drink". Pt denies incident to be a suicide attempt.  Pt denies history of suicide attempt and inpatient admission.  Pt reports consuming two and a half bottles of wine prior to cutting his wrists. Pt reports typically consuming "just a couple beers or so" four to five nights a week. Pt appears to be minimizing alcohol use. Pt denies use of any additional substances.  Pt reports history of ADHD, depression and anxiety. Pt reports compliance with medication. Pt is not currently followed by any outpatient providers.   Pt denies homicidal ideation and thoughts of harm towards others. Pt denies hallucinations. Pt does not appear to be responding to internal stimuli or experiencing delusional thought content.  Clinician attempted to contact mother to obtain collateral information and was unable to reach her. Clinician left HIPAA compliant voice mail message requesting return phone call to [phone].  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 ADHD (attention deficit hyperactivity disorder)   . Anxiety and depression   . 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 Mack Hook with [facility] plans to do surgery as of 10/01/13.  . 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 History of shingles   . Tobacco dependence     Past Surgical History:  Procedure Laterality Date  . NO PAST SURGERIES      Family History:  Family History  Problem Relation Age of Onset  . Heart disease Maternal Grandfather   . Mental illness Maternal Grandfather   . Diabetes Maternal Grandfather   . Colon cancer Neg Hx   . Colon polyps Neg Hx   . Kidney disease Neg Hx   . Esophageal cancer Neg Hx   . Gallbladder disease Neg Hx     Social History:  reports that he has been smoking Cigarettes.  He has a 5.00 pack-year smoking history. He has never used smokeless tobacco. He reports that he drinks alcohol. He reports that he does not use drugs.  Additional Social History:  Alcohol / Drug Use Pain Medications: No abuse reported Prescriptions: No abuse reported. Pt reports compliance with medications. Over the Counter: No abuse reported. History of alcohol / drug use?: Yes Negative Consequences of Use: Legal, Personal relationships Withdrawal Symptoms:  (No withdrawal symptoms reported) Substance #1 Name of Substance 1: Alcohol 1 - Age of First Use: Not Reported 1 - Amount (size/oz): "just a couple beersw or so" 1 - Frequency: 4-5 nights a week 1 - Duration: onging x3 years 1 - Last Use / Amount: PTA/ 2.5 bottles of wine  CIWA: CIWA-Ar BP: 119/56 Pulse Rate: 84 COWS:    PATIENT STRENGTHS: (choose at least two) Average or above average intelligence  Capable of independent living  Allergies:  Allergies  Allergen Reactions  . Nasonex [Mometasone] Other (See Comments)    Nasal irritation/nosebleeds    Home Medications:  (Not in a hospital admission)  OB/GYN Status:  No LMP for male patient.  General Assessment Data Location of Assessment: [facility] TTS Assessment: In system Is this a Tele or Face-to-Face Assessment?: Tele  Assessment Is this an Initial Assessment or a Re-assessment for this encounter?: Initial Assessment Marital status: Single Is patient pregnant?: No Pregnancy Status: No Living Arrangements: Alone Can pt return to current living arrangement?: Yes Admission Status: Involuntary Is patient capable of signing voluntary admission?: No Referral Source: Self/Family/Friend [facility]     Crisis Care Plan Living Arrangements: Alone Name of Psychiatrist: None Name of Therapist: None  Education Status Is patient currently in school?: No Highest grade of school patient has completed: Some College  Risk to self with the past 6 months Suicidal Ideation: No Has patient been a risk to self within the past 6 months prior to admission? : No Suicidal Intent: No Has patient had any suicidal intent within the past 6 months prior to admission? : No Is patient at risk for suicide?: No Suicidal Plan?: No Has patient had any suicidal plan within the past 6 months prior to admission? : No Access to Means: No What has been your use of drugs/alcohol within the last 12 months?: Pt reports alcohol consumption 4-5 days per week Previous Attempts/Gestures: No Other Self Harm Risks: Poor impulse control Intentional Self Injurious Behavior: None (Pt reports none prior to current incident) Family Suicide History: No Recent stressful life event(s): Conflict (Comment) (withmother) Persecutory voices/beliefs?: No Depression: Yes Depression Symptoms: Isolating Substance abuse history and/or treatment for substance abuse?: No Suicide prevention information given to non-admitted patients: Yes  Risk to Others within the past 6 months Homicidal Ideation: No Does patient have any lifetime risk of violence toward others beyond the six months prior to admission? : No Thoughts of Harm to Others: No Current Homicidal Intent: No Current Homicidal Plan: No Access to Homicidal Means: No History of harm to  others?: No Assessment of Violence: None Noted Does patient have access to weapons?: Yes (Comment) (Access to firearms) Criminal Charges Pending?: No Does patient have a court date: No Is patient on probation?: No  Psychosis Hallucinations: None noted Delusions: None noted  Mental Status Report Appearance/Hygiene: In scrubs Eye Contact: Good Motor Activity: Unremarkable Speech: Logical/coherent Level of Consciousness: Alert Mood: Euthymic Affect: Appropriate to circumstance Anxiety Level: None Thought Processes: Coherent, Relevant Judgement: Partial Orientation: Person, Place, Time, Situation Obsessive Compulsive Thoughts/Behaviors: None  Cognitive Functioning Concentration: Normal Memory: Recent Intact, Remote Intact IQ: Average Insight: Fair Impulse Control: Poor Appetite:  (Not Reported) Weight Loss:  (Not Reported) Weight Gain:  (Not Reported) Sleep: No Change Total Hours of Sleep:  (Not Reported) Vegetative Symptoms: None  ADLScreening [facility] Assessment Services) Patient's cognitive ability adequate to safely complete daily activities?: Yes Patient able to express need for assistance with ADLs?: Yes Independently performs ADLs?: Yes (appropriate for developmental age)  Prior Inpatient Therapy Prior Inpatient Therapy: No  Prior Outpatient Therapy Prior Outpatient Therapy: Yes Prior Therapy Dates: Throughout Childhood Prior Therapy Facilty/Provider(s): Pt unable to recall Reason for Treatment: "My parents made me go"- no additional details provided Does patient have an ACCT team?: No Does patient have Intensive In-House Services?  : No Does patient have [facility] services? : No Does patient have P4CC services?: No  ADL Screening (condition at  time of admission) Patient's cognitive ability adequate to safely complete daily activities?: Yes Is the patient deaf or have difficulty hearing?: No Does the patient have difficulty seeing, even when wearing  glasses/contacts?: No Does the patient have difficulty concentrating, remembering, or making decisions?: No Patient able to express need for assistance with ADLs?: Yes Does the patient have difficulty dressing or bathing?: No Independently performs ADLs?: Yes (appropriate for developmental age) Does the patient have difficulty walking or climbing stairs?: No Weakness of Legs: None Weakness of Arms/Hands: None  Home Assistive Devices/Equipment Home Assistive Devices/Equipment: None  Therapy Consults (therapy consults require a physician order) PT Evaluation Needed: No OT Evalulation Needed: No SLP Evaluation Needed: No Abuse/Neglect Assessment (Assessment to be complete while patient is alone) Physical Abuse: Denies Verbal Abuse: Denies Sexual Abuse: Denies Exploitation of patient/patient's resources: Denies Self-Neglect: Denies Values / Beliefs Cultural Requests During Hospitalization: None Spiritual Requests During Hospitalization: None Consults Spiritual Care Consult Needed: No Social Work Consult Needed: No Merchant navy officerAdvance Directives (For Healthcare) Does patient have an advance directive?: No Would patient like information on creating an advanced directive?: No - patient declined information    Additional Information 1:1 In Past 12 Months?: No CIRT Risk: No Elopement Risk: No Does patient have medical clearance?: Yes     Disposition: Clinician consulted with Donell SievertSpencer Simon, PA and pt is recommended for AM Psych Eval. Pt RN Laverda Sorenson(Ann Tuttle) unavailable at this time. John, Unit Secretary has been informed of disposition and will communicate to RN.  Disposition Initial Assessment Completed for this Encounter: Yes Disposition of Patient: Other dispositions Other disposition(s): Other (Comment) (Pending Psychiatric Recommendation)  Emmette Katt J [place] 05/16/2016 12:35 AM

## 2016-05-16 NOTE — Tx Team (Signed)
Initial Treatment Plan 05/16/2016 9:52 PM Nicholas Oliphantaleb Sheron NightingaleM Wu [id number]    PATIENT STRESSORS: Financial difficulties Legal issue Marital or family conflict   PATIENT STRENGTHS: Average or above average intelligence Capable of independent living Communication skills General fund of knowledge Physical Health Supportive family/friends   PATIENT IDENTIFIED PROBLEMS: At risk for suicide  Anxiety  "Anxiety"  "Impulse"               DISCHARGE CRITERIA:  Ability to meet basic life and health needs Improved stabilization in mood, thinking, and/or behavior Medical problems require only outpatient monitoring Motivation to continue treatment in a less acute level of care Need for constant or close observation no longer present  PRELIMINARY DISCHARGE PLAN: Outpatient therapy Participate in family therapy Return to previous living arrangement Return to previous work or school arrangements  PATIENT/FAMILY INVOLVEMENT: This treatment plan has been presented to and reviewed with the patient, Nicholas Wu, and/or family member.  The patient and family have been given the opportunity to ask questions and make suggestions.  Nicholas Wu, Nicholas Lemus P, RN 05/16/2016, 9:52 PM

## 2016-05-16 NOTE — Progress Notes (Signed)
Discussed pt's case with psychiatry team. Dr. Lucianne MussKumar advises pt meets criteria for inpatient treatment and IVC should be upheld.  Ilean SkillMeghan Aryon Nham, MSW, LCSW Clinical Social Work, Disposition  05/16/2016 [phone]

## 2016-05-16 NOTE — Progress Notes (Addendum)
Admission Note:  25 yr old male who presents IVC, in no acute distress, after making self-inflicted cuts to wrist bilateral. Patient denies act as a suicide attempt and states "I was drunk and decided to slit my wrist open trying to get my mom's attention".  Incident was following a verbal altercation with mother.  Patient reports "there was a 6 hour manhunt in the woods to look for me".  Patient minimizes reason for being admitted. Patient was calm and cooperative with admission process. Patient currently denies SI/HI/AVH.  Patient denies hx of substance abuse. Patient reports that he has had two DWI's "within a short period of time and lost my license and car".  Patient reports alcohol use daily and reports drinking 6 or more drinks on a typical day "weekly".  Patient reports marijuana and coke use "a couple of times a year" with last use of either being "a month ago".  Patient reports that this is his first inpatient admission.  Patient currently lives alone and identifies family and friends as his support system.  [facility] patient would like to work on "anxiety and impulse".  Skin was assessed.  Patient has cuts to wrists bilateral and "chigger bites" around waist and from waist down in front and back.  Patient searched and no contraband found, POC and unit policies explained and understanding verbalized. Consents obtained. Food and fluids offered and accepted. Report given to accepting nurse.  Patient placed on q 15 minute checks.  Patient had no additional questions or concerns. Safety maintained on unit.

## 2016-05-17 DIAGNOSIS — F1094 Alcohol use, unspecified with alcohol-induced mood disorder: Secondary | ICD-10-CM

## 2016-05-17 MED ORDER — CHLORDIAZEPOXIDE HCL 25 MG PO CAPS: 25.0000 mg | ORAL_CAPSULE | Freq: Four times a day (QID) | ORAL | Status: DC | PRN

## 2016-05-17 NOTE — [facility] Suicide Risk Assessment (Signed)
[facility]   Nursing information obtained from:  Patient Demographic factors:  Adolescent or young adult, Caucasian, Low socioeconomic status, Living alone Current Mental Status:  Self-harm thoughts, Self-harm behaviors Loss Factors:  Financial problems / change in socioeconomic status Historical Factors:  Family history of mental illness or substance abuse, Impulsivity Risk Reduction Factors:  Sense of responsibility to family, Employed, Positive social support  Total Time spent with patient: 45 minutes Principal Problem: Alcohol-induced mood disorder (HCC) Diagnosis:   Patient Active Problem List   Diagnosis Date Noted  . Alcohol-induced mood disorder (HCC) [F10.94] 05/16/2016  . Anxiety state [F41.1] 05/05/2015  . Seasonal allergic rhinitis [J30.2] 11/10/2014  . Rash [R21] 09/30/2013  . ADHD (attention deficit hyperactivity disorder) [F90.9] 09/25/2012  . Depression [F32.9] 08/13/2012   Subjective Data:  He states that he is here involuntarily after slitting his both wrists to get attention from his mother. He reports frustration about his mother who is paranoid stating that there is conspiracy while he has tried to help with his mother. He states that there was no way she would listen to him although she says that she loves him. He drank 2.5 bottles of wine at that time and states he made wrong decision while he is drunk. He adamantly denies any SI and denies any suicide attempt. He has been drinking alcohol 6-12 beers 4 times per week. He does not think he has problems with alcohol, stating that he would do fine as long as he is away from his mother. Although he denies any interest in going to residential program, he is interested in having outpatient follow up. He is hoping to go back home.   Continued Clinical Symptoms:  Alcohol Use Disorder Identification Test Final Score (AUDIT): 15 The "Alcohol Use Disorders Identification Test", Guidelines for Use in  Primary Care, Second Edition.  World Science writerHealth Organization [facility](WHO). Score between 0-7:  no or low risk or alcohol related problems. Score between 8-15:  moderate risk of alcohol related problems. Score between 16-19:  high risk of alcohol related problems. Score 20 or above:  warrants further diagnostic evaluation for alcohol dependence and treatment.   CLINICAL FACTORS:   Alcohol/Substance Abuse/Dependencies   Musculoskeletal: Strength & Muscle Tone: within normal limits Gait & Station: normal Patient leans: N/A  Psychiatric Specialty Exam: Physical Exam  Constitutional: He is oriented to person, place, and time. He appears well-developed and well-nourished.  Neurological: He is alert and oriented to person, place, and time.  No tremors  Skin: Skin is warm and dry.    Review of Systems  Psychiatric/Behavioral: Positive for substance abuse. Negative for depression, hallucinations and suicidal ideas. The patient is nervous/anxious.   All other systems reviewed and are negative.   Blood pressure 113/69, pulse 83, temperature 98.1 F (36.7 C), temperature source Oral, resp. rate 16, height 6' (1.829 m), weight 164 lb (74.4 kg), SpO2 100 %.Body mass index is 22.24 kg/m.  General Appearance: Casual  Eye Contact:  Good  Speech:  Normal Rate  Volume:  Normal  Mood:  "neutral"  Affect:  down  Thought Process:  Coherent and Goal Directed  Orientation:  Full (Time, Place, and Person)  Thought Content:  Logical Perceptions: denies AH/Vh  Suicidal Thoughts:  No  Homicidal Thoughts:  No  Memory:  itnact  Judgement:  Impaired  Insight:  Lacking  Psychomotor Activity:  Normal  Concentration:  Concentration: Fair and Attention Span: Fair  Recall:  Good  Fund of Knowledge:  Good  Language:  Good  Akathisia:  No  Handed:  Right  AIMS (if indicated):     Assets:  Communication Skills Desire for Improvement  ADL's:  Intact  Cognition:  WNL  Sleep:  Number of Hours: 6.75       COGNITIVE FEATURES THAT CONTRIBUTE TO RISK:  Closed-mindedness and Polarized thinking    SUICIDE RISK:   Mild:  Suicidal ideation of limited frequency, intensity, duration, and specificity.  There are no identifiable plans, no associated intent, mild dysphoria and related symptoms, good self-control (both objective and subjective assessment), few other risk factors, and identifiable protective factors, including available and accessible social support.   PLAN OF CARE: Patient will be admitted to inpatient psychiatric unit for stabilization and safety. Will provide and encourage milieu participation. Provide medication management and maked adjustments as needed.  Will follow daily.   I certify that inpatient services furnished can reasonably be expected to improve the patient's condition.  Neysa Hotter, MD 05/17/2016, 2:08 PM

## 2016-05-17 NOTE — Progress Notes (Signed)
Recreation Therapy Notes  Date: 05/17/16 Time: 0945 Location: 300 Hall Group Room  Group Topic: Stress Management  Goal Area(s) Addresses:  Patient will verbalize importance of using healthy stress management.  Patient will identify positive emotions associated with healthy stress management.   Intervention: Stress Management  Activity :  Progressive Muscle Relaxation.  LRT introduced the technique of progressive muscle relaxation to patients.  Patients were to follow along as LRT read script to engaged in the technique.  Education:  Stress Management, Discharge Planning.   Education Outcome: Acknowledges edcuation/In group clarification offered/Needs additional education  Clinical Observations/Feedback: Pt did not attend group.    Leniyah Martell, LRT/CTRS   Sadrac Zeoli A 05/17/2016 12:59 PM 

## 2016-05-17 NOTE — [facility] Suicide Risk Assessment (Signed)
[facility] INPATIENT:  Family/Significant Other Suicide Prevention Education  Suicide Prevention Education:  Patient Refusal for Family/Significant Other Suicide Prevention Education: The patient Nicholas HarriesCaleb M Wu has refused to provide written consent for family/significant other to be provided Family/Significant Other Suicide Prevention Education during admission and/or prior to discharge.  Physician notified.  CSW reviewed SPE and provided brochure to patient.  Patient flatly denies all suicidal ideation. "I have never thought about putting a gun in my mouth and pulling the trigger." States he does have guns in his apartment, uses to hunt.  Is unwilling to consider having these guns secured.  MD will be informed.    Sallee Langenne C Marquett Bertoli 05/17/2016, 12:44 PM

## 2016-05-17 NOTE — Progress Notes (Signed)
Psychoeducational Group Note  Date:  05/17/2016 Time:  2323  Group Topic/Focus:  Wrap-Up Group:   The focus of this group is to help patients review their daily goal of treatment and discuss progress on daily workbooks.   Participation Level: Did Not Attend  Participation Quality:  Not Applicable  Affect:  Not Applicable  Cognitive:  Not Applicable  Insight:  Not Applicable  Engagement in Group: Not Applicable  Additional Comments: The patient did not attend this evening's A.A. Meeting and remained in bed.    Hazle CocaGOODMAN, Liridona Mashaw S 05/17/2016, 11:23 PM

## 2016-05-17 NOTE — Progress Notes (Signed)
D.  Pt pleasant on approach, complaint of itching from chigger bites.  Pt states that he counted over 200 all together.  Pt requested medication early and went to bed rather than group due to this discomfort.  Pt denies SI/HI/hallucinations at this time.  A.  Support and encouragement offered, medication given as ordered  R.  Pt remains safe on the unit, will continue to monitor.

## 2016-05-17 NOTE — Tx Team (Signed)
Interdisciplinary Treatment and Diagnostic Plan Update  05/17/2016 Time of Session: 2:45 PM  Nicholas Wu MRN: [id number]  Principal Diagnosis: Alcohol-induced mood disorder [name])  Secondary Diagnoses: Principal Problem:   Alcohol-induced mood disorder ([name])   Current Medications:  Current Facility-Administered Medications  Medication Dose Route Frequency Provider Last Rate Last Dose  . acetaminophen (TYLENOL) tablet 650 mg  650 mg Oral Q6H PRN Benjamine Mola, FNP      . alum & mag hydroxide-simeth (MAALOX/MYLANTA) 200-200-20 MG/5ML suspension 30 mL  30 mL Oral Q4H PRN Benjamine Mola, FNP      . Henrietta Hoover lotion   Topical QID PRN Lurena Nida, NP      . chlordiazePOXIDE (LIBRIUM) capsule 25 mg  25 mg Oral Q6H PRN Norman Clay, MD      . hydrOXYzine (ATARAX/VISTARIL) tablet 25 mg  25 mg Oral Q4H PRN Hildred Priest, MD   25 mg at 05/17/16 0826  . loperamide (IMODIUM) capsule 2-4 mg  2-4 mg Oral PRN Benjamine Mola, FNP      . magnesium hydroxide (MILK OF MAGNESIA) suspension 30 mL  30 mL Oral Daily PRN Benjamine Mola, FNP      . multivitamin with minerals tablet 1 tablet  1 tablet Oral Daily Benjamine Mola, FNP   1 tablet at 05/17/16 [phone]  . nicotine (NICODERM CQ - dosed in mg/24 hours) patch 21 mg  21 mg Transdermal Q0600 Hildred Priest, MD      . ondansetron (ZOFRAN-ODT) disintegrating tablet 4 mg  4 mg Oral Q6H PRN Benjamine Mola, FNP      . PARoxetine (PAXIL) tablet 20 mg  20 mg Oral Daily Hildred Priest, MD   20 mg at 05/17/16 0825  . thiamine (B-1) injection 100 mg  100 mg Intramuscular Once Benjamine Mola, FNP      . thiamine (VITAMIN B-1) tablet 100 mg  100 mg Oral Daily Benjamine Mola, FNP      . traZODone (DESYREL) tablet 50 mg  50 mg Oral QHS PRN Hildred Priest, MD        PTA Medications: Prescriptions Prior to Admission  Medication Sig Dispense Refill Last Dose  . dextroamphetamine (DEXEDRINE) 15 MG 24 hr capsule 2 caps po qAM 60  capsule 0 unknown  . fluticasone (CUTIVATE) 0.05 % cream Apply topically 2 (two) times daily. (Patient not taking: Reported on 05/15/2016) 30 g 1 Not Taking at Unknown time  . PARoxetine (PAXIL) 20 MG tablet Take 1 tablet (20 mg total) by mouth daily. 30 tablet 11 unknown    Treatment Modalities: Medication Management, Group therapy, Case management,  1 to 1 session with clinician, Psychoeducation, Recreational therapy.   Physician Treatment Plan for Primary Diagnosis: Alcohol-induced mood disorder ([name]) Long Term Goal(s): Improvement in symptoms so as ready for discharge  Short Term Goals: Ability to demonstrate self-control will improve, Ability to identify and develop effective coping behaviors will improve and Ability to identify triggers associated with substance abuse/mental health issues will improve  Medication Management: Evaluate patient's response, side effects, and tolerance of medication regimen.  Therapeutic Interventions: 1 to 1 sessions, Unit Group sessions and Medication administration.  Evaluation of Outcomes: Not Met  Physician Treatment Plan for Secondary Diagnosis: Principal Problem:   Alcohol-induced mood disorder ([name])   Long Term Goal(s): Improvement in symptoms so as ready for discharge  Short Term Goals: Ability to verbalize feelings will improve, Ability to identify and develop effective coping behaviors will improve and Ability  to identify triggers associated with substance abuse/mental health issues will improve  Medication Management: Evaluate patient's response, side effects, and tolerance of medication regimen.  Therapeutic Interventions: 1 to 1 sessions, Unit Group sessions and Medication administration.  Evaluation of Outcomes: Not Met   RN Treatment Plan for Primary Diagnosis: Alcohol-induced mood disorder ([name]) Long Term Goal(s): Knowledge of disease and therapeutic regimen to maintain health will improve  Short Term Goals: Ability to remain  free from injury will improve, Ability to demonstrate self-control and Ability to identify and develop effective coping behaviors will improve  Medication Management: RN will administer medications as ordered by provider, will assess and evaluate patient's response and provide education to patient for prescribed medication. RN will report any adverse and/or side effects to prescribing provider.  Therapeutic Interventions: 1 on 1 counseling sessions, Psychoeducation, Medication administration, Evaluate responses to treatment, Monitor vital signs and CBGs as ordered, Perform/monitor CIWA, COWS, AIMS and Fall Risk screenings as ordered, Perform wound care treatments as ordered.  Evaluation of Outcomes: Not Met   LCSW Treatment Plan for Primary Diagnosis: Alcohol-induced mood disorder ([name]) Long Term Goal(s): Safe transition to appropriate next level of care at discharge, Engage patient in therapeutic group addressing interpersonal concerns.  Short Term Goals: Increase ability to appropriately verbalize feelings, Increase emotional regulation and Identify triggers associated with mental health/substance abuse issues  Therapeutic Interventions: Assess for all discharge needs, 1 to 1 time with Social worker, Explore available resources and support systems, Assess for adequacy in community support network, Educate family and significant other(s) on suicide prevention, Complete Psychosocial Assessment, Interpersonal group therapy.  Evaluation of Outcomes: Not Met   Progress in Treatment: Attending groups: Pt is new to milieu, continuing to assess  Participating in groups: Pt is new to milieu, continuing to assess  Taking medication as prescribed: Yes, MD continues to assess for medication changes as needed Toleration medication: Yes, no side effects reported at this time Family/Significant other contact made: No, CSW assessing for appropriate contact Patient understands diagnosis: Minimal insight  AEB minimizing alcohol use and danger of self-inflicted wounds Discussing patient identified problems/goals with staff: Yes Medical problems stabilized or resolved: Yes Denies suicidal/homicidal ideation: Yes Issues/concerns per patient self-inventory: None Other: N/A  New problem(s) identified: None identified at this time.   New Short Term/Long Term Goal(s): None identified at this time.   Discharge Plan or Barriers: Pt will return home and follow-up with PCP; declines referral for psychiatric and substance abuse services  Reason for Continuation of Hospitalization: Depression Medication stabilization Suicidal ideation  Estimated Length of Stay: 3-5 days  Attendees: Patient: 05/17/2016  2:45 PM  Physician: Dr. Modesta Messing 05/17/2016  2:45 PM  Nursing: Idell Pickles, Otilio Carpen, Leanne Lovely, RN 05/17/2016  2:45 PM  RN Care Manager: Lars Pinks, RN 05/17/2016  2:45 PM  Social Worker: Peri Maris, LCSW;  05/17/2016  2:45 PM  Recreational Therapist:  05/17/2016  2:45 PM  Other: Lindell Spar, NP; Samuel Jester, NP 05/17/2016  2:45 PM  Other:  05/17/2016  2:45 PM  Other: 05/17/2016  2:45 PM    Scribe for Treatment Team: Bo Mcclintock, LCSW 05/17/2016 2:45 PM

## 2016-05-17 NOTE — H&P (Addendum)
Psychiatric Admission Assessment Adult  Patient Identification: Nicholas Wu MRN:  [id number] Date of Evaluation:  05/17/2016 Chief Complaint:  Alcohol use disorder severe MDD recurrent moderate GAD ADHD Principal Diagnosis: Alcohol-induced mood disorder ([place]) Diagnosis:   Patient Active Problem List   Diagnosis Date Noted  . Alcohol-induced mood disorder ([place]) [F10.94] 05/16/2016  . Anxiety state [F41.1] 05/05/2015  . Seasonal allergic rhinitis [J30.2] 11/10/2014  . Rash [R21] 09/30/2013  . ADHD (attention deficit hyperactivity disorder) [F90.9] 09/25/2012  . Depression [F32.9] 08/13/2012   History of Present Illness:  26 year old man with anxiety, ADHD who was brought to ED under IVC for danger to self. Per IVC, patient drank alcohol, cut his wrist and ran into the woods.   He states that he is here involuntarily after slitting his both wrists to get attention from his mother. He reports frustration about his mother who is paranoid stating that there is conspiracy while he has tried to help with his mother. He states that there was no way she would listen to him although she says that she loves him. He drank 2.5 bottles of wine at that time and states he made wrong decision while he is drunk. He adamantly denies any SI and denies any suicide attempt. He has been drinking alcohol 6-12 beers 4 times per week. He does not think he has problems with alcohol, stating that he would do fine as long as he is away from his mother. Although he denies any interest in going to residential program, he is interested in having outpatient follow up. He is hoping to go back home.   He states he feels "neutral" today. He denies SI/previous suicide attempt. He denies Hi/Ah/VH. He denies any withdrawal in the past and declines to take Librium for detox. He denies other drug use. He has been taking Paxil for anxiety. which has been prescribed by his PCP. Of note, he states he has been prescribed Dexedrine.  Per [place] substance database, he was not prescribed on any controlled substance for the past 6 months. Patient agrees the team to contact with his mother, but reports "not today."  Associated Signs/Symptoms: Depression Symptoms:  anxiety, (Hypo) Manic Symptoms:  denies Anxiety Symptoms:  mild anxiety Psychotic Symptoms:  denies PTSD Symptoms: Negative Total Time spent with patient: 45 minutes  Past Psychiatric History: anxiety, ADHD  Is the patient at risk to self? Yes.    Has the patient been a risk to self in the past 6 months? No.  Has the patient been a risk to self within the distant past? No.  Is the patient a risk to others? No.  Has the patient been a risk to others in the past 6 months? No.  Has the patient been a risk to others within the distant past? No.   Prior Inpatient Therapy:  He has been admitted for inpatient unit due to his DUI. Denies other psychiatry admission.  Prior Outpatient Therapy:  denies  Alcohol Screening: 1. How often do you have a drink containing alcohol?: 4 or more times a week 2. How many drinks containing alcohol do you have on a typical day when you are drinking?: 5 or 6 3. How often do you have six or more drinks on one occasion?: Weekly Preliminary Score: 5 4. How often during the last year have you found that you were not able to stop drinking once you had started?: Never 5. How often during the last year have you failed to do what  was normally expected from you becasue of drinking?: Less than monthly 6. How often during the last year have you needed a first drink in the morning to get yourself going after a heavy drinking session?: Never 7. How often during the last year have you had a feeling of guilt of remorse after drinking?: Less than monthly 8. How often during the last year have you been unable to remember what happened the night before because you had been drinking?: Never 9. Have you or someone else been injured as a result of your  drinking?: No 10. Has a relative or friend or a doctor or another health worker been concerned about your drinking or suggested you cut down?: Yes, during the last year Alcohol Use Disorder Identification Test Final Score (AUDIT): 15 Brief Intervention: Yes Substance Abuse History in the last 12 months:  Yes.   Consequences of Substance Abuse: DUI Previous Psychotropic Medications: Yes  Psychological Evaluations: No  Past Medical History:  Past Medical History:  Diagnosis Date  . ADHD (attention deficit hyperactivity disorder)   . Anxiety and depression   . 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 Milly Jakob with [facility] plans to do surgery as of 10/01/13.  . 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 History of shingles   . Tobacco dependence     Past Surgical History:  Procedure Laterality Date  . NO PAST SURGERIES     Family History:  Family History  Problem Relation Age of Onset  . Heart disease Maternal Grandfather   . Mental illness Maternal Grandfather   . Diabetes Maternal Grandfather   . Colon cancer Neg Hx   . Colon polyps Neg Hx   . Kidney disease Neg Hx   . Esophageal cancer Neg Hx   . Gallbladder disease Neg Hx    Family Psychiatric  History: mother attempted suicide Tobacco Screening: Have you used any form of tobacco in the last 30 days? (Cigarettes, Smokeless Tobacco, Cigars, and/or Pipes): Yes Tobacco use, Select all that apply: 5 or more cigarettes per day Are you interested in Tobacco Cessation Medications?: No, patient refused Counseled patient on smoking cessation including recognizing danger situations, developing coping skills and basic information about quitting provided: Refused/Declined practical counseling Social History:  History  Alcohol Use  . 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 Nasonex [Mometasone] Other (See Comments)    Nasal irritation/nosebleeds   Lab Results:  Results for orders placed or performed during the hospital encounter of 05/15/16 (from the past 48 hour(s))  Comprehensive metabolic panel     Status: Abnormal   Collection Time: 05/15/16  2:57 PM  Result Value Ref Range   Sodium 136 135 - 145 mmol/L   Potassium 4.2 3.5 - 5.1 mmol/L   Chloride 106 101 - 111 mmol/L   CO2 23 22 - 32 mmol/L   Glucose, Bld 94 65 -  99 mg/dL   BUN 12 6 - 20 mg/dL   Creatinine, Ser 1.04 0.61 - 1.24 mg/dL   Calcium 8.6 (L) 8.9 - 10.3 mg/dL   Total Protein 8.6 (H) 6.5 - 8.1 g/dL   Albumin 5.1 (H) 3.5 - 5.0 g/dL   AST 28 15 - 41 U/L   ALT 26 17 - 63 U/L   Alkaline Phosphatase 68 38 - 126 U/L   Total Bilirubin 0.6 0.3 - 1.2 mg/dL   GFR calc non Af Amer >60 >60 mL/min   GFR calc Af Amer >60 >60 mL/min    Comment: (NOTE) The eGFR has been calculated using the CKD EPI equation. This calculation has not been validated in all clinical situations. eGFR's persistently <60 mL/min signify possible Chronic Kidney Disease.    Anion gap 7 5 - 15  Ethanol     Status: Abnormal   Collection Time: 05/15/16  2:57 PM  Result Value Ref Range   Alcohol, Ethyl (B) 122 (H) <5 mg/dL    Comment:        LOWEST DETECTABLE LIMIT FOR SERUM ALCOHOL IS 5 mg/dL FOR MEDICAL PURPOSES ONLY   Salicylate level     Status: None   Collection Time: 05/15/16  2:57 PM  Result Value Ref Range   Salicylate Lvl <2.7 2.8 - 30.0 mg/dL  Acetaminophen level     Status: Abnormal   Collection Time: 05/15/16  2:57 PM  Result Value Ref Range   Acetaminophen  (Tylenol), Serum <10 (L) 10 - 30 ug/mL    Comment:        THERAPEUTIC CONCENTRATIONS VARY SIGNIFICANTLY. A RANGE OF 10-30 ug/mL MAY BE AN EFFECTIVE CONCENTRATION FOR MANY PATIENTS. HOWEVER, SOME ARE BEST TREATED AT CONCENTRATIONS OUTSIDE THIS RANGE. ACETAMINOPHEN CONCENTRATIONS >150 ug/mL AT 4 HOURS AFTER INGESTION AND >50 ug/mL AT 12 HOURS AFTER INGESTION ARE OFTEN ASSOCIATED WITH TOXIC REACTIONS.   cbc     Status: Abnormal   Collection Time: 05/15/16  2:57 PM  Result Value Ref Range   WBC 10.9 (H) 4.0 - 10.5 K/uL   RBC 5.04 4.22 - 5.81 MIL/uL   Hemoglobin 15.2 13.0 - 17.0 g/dL   HCT 44.3 39.0 - 52.0 %   MCV 87.9 78.0 - 100.0 fL   MCH 30.2 26.0 - 34.0 pg   MCHC 34.3 30.0 - 36.0 g/dL   RDW 13.1 11.5 - 15.5 %   Platelets 252 150 - 400 K/uL  Rapid urine drug screen (hospital performed)     Status: Abnormal   Collection Time: 05/15/16  8:56 PM  Result Value Ref Range   Opiates NONE DETECTED NONE DETECTED   Cocaine NONE DETECTED NONE DETECTED   Benzodiazepines NONE DETECTED NONE DETECTED   Amphetamines POSITIVE (A) NONE DETECTED   Tetrahydrocannabinol NONE DETECTED NONE DETECTED   Barbiturates NONE DETECTED NONE DETECTED    Comment:        DRUG SCREEN FOR MEDICAL PURPOSES ONLY.  IF CONFIRMATION IS NEEDED FOR ANY PURPOSE, NOTIFY LAB WITHIN 5 DAYS.        LOWEST DETECTABLE LIMITS FOR URINE DRUG SCREEN Drug Class       Cutoff (ng/mL) Amphetamine      1000 Barbiturate      200 Benzodiazepine   062 Tricyclics       376 Opiates          300 Cocaine          300 THC  50     Blood Alcohol level:  Lab Results  Component Value Date   ETH 122 (H) 26/94/8546    Metabolic Disorder Labs:  No results found for: HGBA1C, MPG No results found for: PROLACTIN No results found for: CHOL, TRIG, HDL, CHOLHDL, VLDL, LDLCALC  Current Medications: Current Facility-Administered Medications  Medication Dose Route Frequency Provider Last Rate Last Dose  .  acetaminophen (TYLENOL) tablet 650 mg  650 mg Oral Q6H PRN Benjamine Mola, FNP      . alum & mag hydroxide-simeth (MAALOX/MYLANTA) 200-200-20 MG/5ML suspension 30 mL  30 mL Oral Q4H PRN Benjamine Mola, FNP      . Henrietta Hoover lotion   Topical QID PRN Lurena Nida, NP      . hydrOXYzine (ATARAX/VISTARIL) tablet 25 mg  25 mg Oral Q4H PRN Hildred Priest, MD   25 mg at 05/17/16 0826  . loperamide (IMODIUM) capsule 2-4 mg  2-4 mg Oral PRN Benjamine Mola, FNP      . magnesium hydroxide (MILK OF MAGNESIA) suspension 30 mL  30 mL Oral Daily PRN Benjamine Mola, FNP      . multivitamin with minerals tablet 1 tablet  1 tablet Oral Daily Benjamine Mola, FNP   1 tablet at 05/17/16 [phone]  . nicotine (NICODERM CQ - dosed in mg/24 hours) patch 21 mg  21 mg Transdermal Q0600 Hildred Priest, MD      . ondansetron (ZOFRAN-ODT) disintegrating tablet 4 mg  4 mg Oral Q6H PRN Benjamine Mola, FNP      . PARoxetine (PAXIL) tablet 20 mg  20 mg Oral Daily Hildred Priest, MD   20 mg at 05/17/16 0825  . thiamine (B-1) injection 100 mg  100 mg Intramuscular Once Benjamine Mola, FNP      . thiamine (VITAMIN B-1) tablet 100 mg  100 mg Oral Daily Benjamine Mola, FNP      . traZODone (DESYREL) tablet 50 mg  50 mg Oral QHS PRN Hildred Priest, MD       PTA Medications: Prescriptions Prior to Admission  Medication Sig Dispense Refill Last Dose  . dextroamphetamine (DEXEDRINE) 15 MG 24 hr capsule 2 caps po qAM 60 capsule 0 unknown  . fluticasone (CUTIVATE) 0.05 % cream Apply topically 2 (two) times daily. (Patient not taking: Reported on 05/15/2016) 30 g 1 Not Taking at Unknown time  . PARoxetine (PAXIL) 20 MG tablet Take 1 tablet (20 mg total) by mouth daily. 30 tablet 11 unknown    Musculoskeletal: Strength & Muscle Tone: within normal limits Gait & Station: normal Patient leans: N/A  Psychiatric Specialty Exam: Physical Exam  Nursing note and vitals reviewed. Constitutional: He is  oriented to person, place, and time. He appears well-developed and well-nourished.  Neurological: He is alert and oriented to person, place, and time.  No tremors  Skin: Skin is warm and dry.    Review of Systems  Constitutional: Negative for chills.  Eyes: Negative for blurred vision.  Respiratory: Negative for shortness of breath.   Cardiovascular: Negative for chest pain and palpitations.  Gastrointestinal: Positive for diarrhea. Negative for nausea and vomiting.  Skin: Positive for rash.  Neurological: Negative for dizziness, tingling and headaches.  Psychiatric/Behavioral: Positive for substance abuse. Negative for depression, hallucinations and suicidal ideas. The patient is nervous/anxious. The patient does not have insomnia.   All other systems reviewed and are negative.   Blood pressure 113/69, pulse 83, temperature 98.1 F (36.7 C), temperature source Oral,  resp. rate 16, height 6' (1.829 m), weight 164 lb (74.4 kg), SpO2 100 %.Body mass index is 22.24 kg/m.  General Appearance: Casual  Eye Contact:  Good  Speech:  Normal Rate  Volume:  Normal  Mood:  "neutral"  Affect:  down  Thought Process:  Coherent and Goal Directed  Orientation:  Full (Time, Place, and Person)  Thought Content:  Logical Perceptions: denies AH/VH  Suicidal Thoughts:  No  Homicidal Thoughts:  No  Memory:  intact  Judgement:  Impaired  Insight:  Lacking  Psychomotor Activity:  Normal  Concentration:  Concentration: Fair and Attention Span: Fair  Recall:  [place] of Knowledge:  Good  Language:  Fair  Akathisia:  No  Handed:  Right  AIMS (if indicated):     Assets:  Communication Skills Desire for Improvement  ADL's:  Intact  Cognition:  WNL  Sleep:  Number of Hours: 6.75   Assessment 25 year old man with alcohol use disorder, anxiety, self reported history of ADHD who was brought to ED under IVC for danger to self; patient cut his both wrists to "get attention" from his mother in the  setting of alcohol use (EtOH 122 on 8/23)  # Alcohol induced mood disorder # r/o MDD # r/o GAD Patient denies any significant mood symptoms except irritability in the setting of discordance with his mother/alcohol use. Patient reports his preference to continue his home meds of Paxil. Will continue. He declined the option of starting gabapentin to target his anxiety/craving.   # Alcohol use disorder Patient is at precontemplative stage and is not interested in any treatment (except outpatient follow up for his anxiety). Will continue motivational interview. He has no significant signs of withdrawal and reports preference not to take any medication for detox. Discussed the risk of withdrawal symptoms which includes seizure/death. Will have prn available.  Plans - Continue Paxil 20 mg daily - Librium q6hprn for withdrawal - Patient is IVC'd. Will plan to obtain collateral from his mother (patient declined to talk today but agreed for another day) - Check TSH - Medication management to reduce current symptoms to base line and improve the patient's overall level of functioning. - Monitor for the adverse effect of the medications and anger outbursts - Continue 15 minutes observation for safety concerns - Encouraged to participate in milieu therapy and group therapy counseling sessions and also work with coping skills -  Develop treatment plan to decrease risk of relapse upon discharge and to reduce the need for readmission. -  Psycho-social education regarding relapse prevention and self care. - Health care follow up as needed for medical problems. - Restart home medications where appropriate.   Treatment Plan Summary: Daily contact with patient to assess and evaluate symptoms and progress in treatment  Observation Level/Precautions:  15 minute checks  Laboratory:  as needed  Psychotherapy:  Individual and group therapy  Medications:  As above  Consultations:  As needed  Discharge  Concerns:  -  Estimated LOS:5-7 days  Other:     I certify that inpatient services furnished can reasonably be expected to improve the patient's condition.    Norman Clay, MD 8/25/20171:35 PM
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
## 2016-05-17 NOTE — Progress Notes (Signed)
Nursing Note 05/17/2016 [id number]  Data Reports sleeping "like a rock."  Patient denies withdrawal symptoms "I don't drink every day." CIWA 0.  Declined scheduled librium. Rates depression 1/10, hopelessness 0/10, and anxiety 2/10. Affect appropriate and relaxed mood "ok."  Denies HI, SI, AVH.  Slept in this AM but awake and seen in day area in afternoon.  Went outside for recreation time.  Patient reports itching from chigger bites.     Action Spoke with patient 1:1, nurse offered support to patient throughout shift.  Librium D/C'd by MD.  Caren Hazyontinues to be monitored on 15 minute checks for safety.  Given calamine lotion and vistaril for itching.  Dressings changed on wrists.  Response Reports vistaril and calamine lotion effective; however, reports feeling very sleepy after receiving vistaril.  Suggested he try taking vistaril at bedtime.  Remains safe and appropriate on unit.

## 2016-05-17 NOTE — [facility] Group Notes (Signed)
[facility] LCSW Group Therapy  05/17/2016 5:33 PM  Type of Therapy:  Group Therapy  Participation Level:  Did Not Attend  Modes of Intervention:  Discussion, Education, Socialization and Support  Summary of Progress/Problems: Feelings around Relapse. Group members discussed the meaning of relapse and shared personal stories of relapse, how it affected them and others, and how they perceived themselves during this time. Group members were encouraged to identify triggers, warning signs and coping skills used when facing the possibility of relapse. Social supports were discussed and explored in detail.  Gleen Ripberger L Conal Shetley MSW, LCSWA  05/17/2016, 5:33 PM   

## 2016-05-18 ENCOUNTER — Encounter (HOSPITAL_COMMUNITY): Payer: Self-pay | Admitting: Registered Nurse

## 2016-05-18 DIAGNOSIS — Z800 Family history of malignant neoplasm of digestive organs: Secondary | ICD-10-CM

## 2016-05-18 DIAGNOSIS — Z833 Family history of diabetes mellitus: Secondary | ICD-10-CM

## 2016-05-18 DIAGNOSIS — Z8371 Family history of colonic polyps: Secondary | ICD-10-CM

## 2016-05-18 DIAGNOSIS — Z8051 Family history of malignant neoplasm of kidney: Secondary | ICD-10-CM

## 2016-05-18 DIAGNOSIS — F1994 Other psychoactive substance use, unspecified with psychoactive substance-induced mood disorder: Secondary | ICD-10-CM

## 2016-05-18 DIAGNOSIS — Z818 Family history of other mental and behavioral disorders: Secondary | ICD-10-CM

## 2016-05-18 DIAGNOSIS — F17210 Nicotine dependence, cigarettes, uncomplicated: Secondary | ICD-10-CM

## 2016-05-18 DIAGNOSIS — Z791 Long term (current) use of non-steroidal anti-inflammatories (NSAID): Secondary | ICD-10-CM

## 2016-05-18 DIAGNOSIS — Z79899 Other long term (current) drug therapy: Secondary | ICD-10-CM

## 2016-05-18 LAB — TSH: TSH: 3.363 u[IU]/mL (ref 0.350–4.500)

## 2016-05-18 NOTE — Progress Notes (Signed)
D: Pt visible in milieu at intervals during shift. Denies SI, HI and AVH when assessed. Minimal but appropriate interactions observed with peers and staff. Reported good appetite and sleep pattern. Stated to Clinical research associatewriter "I feel better today". A: Support and encouragement provided to pt. Scheduled and PRN (Vistaril, calamine lotion--itching) medications administered as ordered. Q 15 minutes checks maintained for safety. R: Pt declined nicotine patch when offered this AM but was compliant with all other scheduled medications. Pt did not attend scheduled AM group despite encouragement and prompts. Remains safe on and off unit.
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
## 2016-05-18 NOTE — Progress Notes (Signed)
Psychoeducational Group Note  Date:  05/18/2016 Time:  2319  Group Topic/Focus:  Wrap-Up Group:   The focus of this group is to help patients review their daily goal of treatment and discuss progress on daily workbooks.   Participation Level: Did Not Attend  Participation Quality:  Not Applicable  Affect:  Not Applicable  Cognitive:  Not Applicable  Insight:  Not Applicable  Engagement in Group: Not Applicable  Additional Comments:  The patient did not attend the A.A. Meeting and elected to color instead.    Hazle CocaGOODMAN, Earlena Werst S 05/18/2016, 11:19 PM

## 2016-05-18 NOTE — Progress Notes (Signed)
D.  Pt pleasant on approach, denies complaints at this time.  Pt did not attend evening AA group, minimal interaction on unit.  Pt continues to itch from chigger bites but states that they are somewhat better today.  Pt denies SI/HI/hallucinations at this time.  A.  Support and encouragement offered  R.  Pt remains safe on the unit, will continue to monitor.

## 2016-05-18 NOTE — [facility] Group Notes (Signed)
[facility] Group Notes:  (Nursing---Healthy Coping Skills)  Date:  05/18/2016  Time:  0900 Type of Therapy:  Nurse Education  Participation Level:  Did Not Attend  Summary of Progress/Problems: Pt did not attend scheduled AM group despite multiple verbal encouragement. Ouida SillsWesseh, Lincoln Maxinlivette 05/18/2016, 0900
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
## 2016-05-19 NOTE — Progress Notes (Signed)
[facility] MD Progress Note  05/19/2016 12:48 PM Nicholas Wu  MRN:  [id number]    Subjective:  "I'm good" Patient seen by this provider and chart reviewed 05/19/16.  On evaluation:  Nicholas Wu reports that he is doing good.  States that he still hasn't spoken to his family.  States that he is not a violent person but is easily irritated.  Reports that he is interested in setting up outpatient services.  Patient is very restrictive during conversation mostly yes or now answers and when did talk no elaboration given.  At this time patient rates depression 0/10 and anxiety 1/10 (0/10 none and 10/10 worse).  He denies suicidal/homicidal ideation, psychosis, and paranoia.  Reports that he is tolerating his medications without adverse reaction; sleeping and eating without difficulty; and attending/participating group sessions.    Principal Problem: Alcohol-induced mood disorder (HCC) Diagnosis:   Patient Active Problem List   Diagnosis Date Noted  . 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 Alcohol-induced mood disorder (HCC) [F10.94] 05/16/2016  . Anxiety state [F41.1] 05/05/2015  . Seasonal allergic rhinitis [J30.2] 11/10/2014  . Rash [R21] 09/30/2013  . ADHD (attention deficit hyperactivity disorder) [F90.9] 09/25/2012  . Depression [F32.9] 08/13/2012   Total Time spent with patient: 15 minutes  Past Psychiatric History: Prior inpatient hospitalization related to DUI unit due to his DUI. Denies other psychiatry admission.   Past Medical History:  Past Medical History:  Diagnosis Date  . ADHD (attention deficit hyperactivity disorder)   . Anxiety and depression   . 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 Mack Hook with [facility] plans to do surgery as of 10/01/13.  . 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 History of shingles   . Tobacco dependence     Past Surgical History:  Procedure Laterality Date  . NO PAST  SURGERIES     Family History:  Family History  Problem Relation Age of Onset  . Heart disease Maternal Grandfather   . Mental illness Maternal Grandfather   . Diabetes Maternal Grandfather   . Colon cancer Neg Hx   . Colon polyps Neg Hx   . Kidney disease Neg Hx   . Esophageal cancer Neg Hx   . Gallbladder disease Neg Hx    Family Psychiatric  History: Denies  Social History:  History  Alcohol Use  . 0.0 oz/week    Comment: 2 beers daily     History  Drug Use No    Social History   Social History  . Marital status: Single    Spouse name: N/A  . Number of children: 0  . 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 Smoking status: Current Every Day Smoker    Packs/day: 0.50    Years: 10.00    Types: Cigarettes  . Smokeless tobacco: Never Used  . Alcohol use 0.0 oz/week     Comment: 2 beers daily  . Drug use: No  . Sexual activity: Yes    Partners: Female   Other Topics Concern  . None   Social History Narrative   Single, no children.   From [place].  Does restaurant work.    Some college education--currently taking a break as of 07/2012.   Exercises occasionally.  No dietary restrictions.   Additional Social History:   Sleep: Good  Appetite:  Good  Current Medications: Current Facility-Administered Medications  Medication Dose Route Frequency Provider Last Rate Last Dose  . acetaminophen (TYLENOL) tablet 650 mg  650 mg Oral Q6H PRN Beau Fanny, FNP      . alum & mag hydroxide-simeth (MAALOX/MYLANTA) 200-200-20 MG/5ML suspension 30 mL  30 mL Oral Q4H PRN Beau Fanny, FNP      . Veronda Prude lotion   Topical QID PRN Kristeen Mans, NP   1 application at 05/18/16 1720  . chlordiazePOXIDE (LIBRIUM) capsule 25 mg  25 mg Oral Q6H PRN Neysa Hotter, MD      . hydrOXYzine (ATARAX/VISTARIL) tablet 25 mg  25 mg Oral Q4H PRN Jimmy Footman, MD   25 mg at 05/18/16 1820  . loperamide (IMODIUM)  capsule 2-4 mg  2-4 mg Oral PRN Beau Fanny, FNP      . magnesium hydroxide (MILK OF MAGNESIA) suspension 30 mL  30 mL Oral Daily PRN Beau Fanny, FNP      . multivitamin with minerals tablet 1 tablet  1 tablet Oral Daily Beau Fanny, FNP   1 tablet at 05/19/16 0809  . ondansetron (ZOFRAN-ODT) disintegrating tablet 4 mg  4 mg Oral Q6H PRN Beau Fanny, FNP      . PARoxetine (PAXIL) tablet 20 mg  20 mg Oral Daily Jimmy Footman, MD   20 mg at 05/19/16 0809  . thiamine (B-1) injection 100 mg  100 mg Intramuscular Once Beau Fanny, FNP      . thiamine (VITAMIN B-1) tablet 100 mg  100 mg Oral Daily Beau Fanny, FNP   100 mg at 05/18/16 0837  . traZODone (DESYREL) tablet 50 mg  50 mg Oral QHS PRN Jimmy Footman, MD        Lab Results:  Results for orders placed or performed during the hospital encounter of 05/16/16 (from the past 48 hour(s))  TSH     Status: None   Collection Time: 05/18/16  6:11 PM  Result Value Ref Range   TSH 3.363 0.350 - 4.500 uIU/mL    Comment: Performed at [facility]    Blood Alcohol level:  Lab Results  Component Value Date   ETH 122 (H) 05/15/2016    Metabolic Disorder Labs: No results found for: HGBA1C, MPG No results found for: PROLACTIN No results found for: CHOL, TRIG, HDL, CHOLHDL, VLDL, LDLCALC  Physical Findings: AIMS: Facial and Oral Movements Muscles of Facial Expression: None, normal Lips and Perioral Area: None, normal Jaw: None, normal Tongue: None, normal,Extremity Movements Upper (arms, wrists, hands, fingers): None, normal Lower (legs, knees, ankles, toes): None, normal, Trunk Movements Neck, shoulders, hips: None, normal, Overall Severity Severity of abnormal movements (highest score from questions above): None, normal Incapacitation due to abnormal movements: None, normal Patient's awareness of abnormal movements (rate only patient's report): No Awareness, Dental Status Current  problems with teeth and/or dentures?: No Does patient usually wear dentures?: No  CIWA:  CIWA-Ar Total: 0 COWS:     Musculoskeletal: Strength & Muscle Tone: within normal limits Gait & Station: normal Patient leans: N/A  Psychiatric Specialty Exam: Physical Exam  Nursing note and vitals reviewed.   Review of Systems  Psychiatric/Behavioral: Positive for depression, substance abuse and suicidal ideas (Denies at this time). Negative for hallucinations. The patient is nervous/anxious. The patient does not have insomnia.   All other systems reviewed and are negative.   Blood pressure 118/83, pulse 68, temperature 98.3 F (36.8 C), resp. rate 16, height  6' (1.829 m), weight 74.4 kg (164 lb), SpO2 100 %.Body mass index is 22.24 kg/m.  General Appearance: Casual  Eye Contact:  Good  Speech:  Clear and Coherent and Normal Rate  Volume:  Normal  Mood:  Anxious and Depressed  Affect:  Depressed  Thought Process:  Goal Directed  Orientation:  Full (Time, Place, and Person)  Thought Content:  Logical  Suicidal Thoughts:  No  Homicidal Thoughts:  No  Memory:  Immediate;   Good Recent;   Good Remote;   Good  Judgement:  Fair  Insight:  Lacking  Psychomotor Activity:  Normal  Concentration:  Concentration: Fair and Attention Span: Fair  Recall:  Good  Fund of Knowledge:  Fair  Language:  Good  Akathisia:  No  Handed:  Right  AIMS (if indicated):     Assets:  Communication Skills Desire for Improvement Physical Health  ADL's:  Intact  Cognition:  WNL  Sleep:  Number of Hours: 6.75     Treatment Plan Summary: Daily contact with patient to assess and evaluate symptoms and progress in treatment and Medication management   Plans - Continue Paxil 20 mg daily - Librium q6hprn for withdrawal - Patient is IVC'd. Will plan to obtain collateral from his mother (patient declined to talk today but agreed for another day) - Check TSH - Medication management to reduce current symptoms  to base line and improve the patient's overall level of functioning. - Monitor for the adverse effect of the medications and anger outbursts - Continue 15 minutes observation for safety concerns - Encouraged to participate in milieu therapy and group therapy counseling sessions and also work with coping skills -  Develop treatment plan to decrease risk of relapse upon discharge and to reduce the need for readmission. -  Psycho-social education regarding relapse prevention and self care. - Health care follow up as needed for medical problems. - Restart home medications where appropriate.  Continue with current treatment plan; no changes at this time   Rankin, Denice BorsShuvon, NP 05/19/2016, 12:48 PM   Agree with NP Progress Note as above

## 2016-05-19 NOTE — [facility] Group Notes (Signed)
[facility] Group Notes: (Clinical Social Work)   05/19/2016      Type of Therapy:  Group Therapy   Participation Level:  Did Not Attend despite MHT prompting   Jaila Schellhorn Grossman-Orr, LCSW 05/19/2016, 12:23 PM     

## 2016-05-19 NOTE — Progress Notes (Signed)
D.  Pt pleasant on approach, no complaints voiced.  Pt did not attend evening AA group, has not attended evening group this weekend.  Pt observed with minimal interaction with peers on unit.  Pt denies SI/HI/hallucinations at this time.  A.  Support and encouragement offered  R.   Pt remains safe on the unit, will continue to monitor.

## 2016-05-19 NOTE — Progress Notes (Signed)
D: Pt visible in milieu during scheduled groups. A & O to self, place and situation surrounding admission. Rates his depression 0/10, hopelessness 0/10 and anxiety 1/10. Denies SI, HI, AVH and pain when assessed. Cooperative with care thus far. Escorted from and back to cafeteria this afternoon related to itching but declined PRN Vistaril when offered, stated "It just makes me sleep, so maybe later but not now, I'll just use my lotion for now". Minimal but appropriate interactions observed with peers and staff. A: Encouraged pt to attend group and voice concerns. Support and availability offered. Medications administered as ordered. Q 15 minutes checks remains effective for safety.  R: Pt attended groups on the unit. Declined scheduled Thiamine Vitamin this AM and PRN Vistaril (c/o itching) when offered,stated to Clinical research associatewriter " I don't need it" despite verbal education. Reports relief "soothing sensation" from his itching with his calamine lotion.  Remains safe on and off unit.

## 2016-05-19 NOTE — Progress Notes (Signed)
Pt did not attend the evening AA meeting. Pt was in his room sleeping at the time of group. Caswell CorwinOwen, Jamee Pacholski C 8:19 PM 05/19/16

## 2016-05-19 NOTE — Plan of Care (Signed)
Problem: Activity: Goal: Interest or engagement in activities will improve Outcome: Not Progressing Pt has not attended evening AA group this weekend

## 2016-05-19 NOTE — [facility] Group Notes (Signed)
[facility] Group Notes:  (Nursing/MHT/Case Management/Adjunct)  Date:  05/19/2016  Time:  10:22 AM  Type of Therapy:  Nurse Education  Participation Level:  Minimal  Participation Quality:  Appropriate  Affect:  Appropriate  Cognitive:  Appropriate  Insight:  Limited  Engagement in Group:  Lacking  Modes of Intervention:  Discussion, Education and Support  Summary of Progress/Problems: Keynan arrived at the end of group.  Maurine SimmeringShugart, Ladrea Holladay M 05/19/2016, 10:22 AM

## 2016-05-20 MED ORDER — PAROXETINE HCL 20 MG PO TABS: 20.0000 mg | ORAL_TABLET | Freq: Every day | ORAL | 0 refills | Status: DC

## 2016-05-20 MED ORDER — CALAMINE EX LOTN: TOPICAL_LOTION | Freq: Four times a day (QID) | CUTANEOUS | 0 refills | Status: DC | PRN

## 2016-05-20 MED ORDER — TRAZODONE HCL 50 MG PO TABS: 50.0000 mg | ORAL_TABLET | Freq: Every evening | ORAL | 0 refills | Status: DC | PRN

## 2016-05-20 MED ORDER — HYDROXYZINE HCL 25 MG PO TABS: 25.0000 mg | ORAL_TABLET | ORAL | 0 refills | Status: DC | PRN

## 2016-05-20 NOTE — Discharge Summary (Signed)
Physician Discharge Summary Note  Patient:  Nicholas Wu is an 25 y.o., male MRN:  [id number] DOB:  [birth year] Patient phone:  [phone] (home)  Patient address:   [address] [place] [address],  Total Time spent with patient: Greater than 30 minutes  Date of Admission:  05/16/2016  Date of Discharge: 05-20-16  Reason for Admission: Self inflicted lacerations.  Principal Problem: Alcohol-induced mood disorder [facility])  Discharge Diagnoses: Patient Active Problem List   Diagnosis Date Noted  . 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 Alcohol-induced mood disorder (HCC) [F10.94] 05/16/2016  . Anxiety state [F41.1] 05/05/2015  . Seasonal allergic rhinitis [J30.2] 11/10/2014  . Rash [R21] 09/30/2013  . ADHD (attention deficit hyperactivity disorder) [F90.9] 09/25/2012  . Depression [F32.9] 08/13/2012   Past Psychiatric History: Alcohol use disorder, ADHD  Past Medical History:  Past Medical History:  Diagnosis Date  . ADHD (attention deficit hyperactivity disorder)   . Anxiety and depression   . 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 Mack Hook with [facility] plans to do surgery as of 10/01/13.  . 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 History of shingles   . Tobacco dependence     Past Surgical History:  Procedure Laterality Date  . NO PAST SURGERIES     Family History:  Family History  Problem Relation Age of Onset  . Heart disease Maternal Grandfather   . Mental illness Maternal Grandfather   . Diabetes Maternal Grandfather   . Colon cancer Neg Hx   . Colon polyps Neg Hx   . Kidney disease Neg Hx   . Esophageal cancer Neg Hx   . Gallbladder disease Neg Hx    Family Psychiatric  History: See H&P  Social History:  History  Alcohol Use  . 0.0 oz/week    Comment: 2 beers daily     History  Drug Use No    Social History   Social History  . Marital status: Single    Spouse name:  N/A  . Number of children: 0  . 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 Smoking status: Current Every Day Smoker    Packs/day: 0.50    Years: 10.00    Types: Cigarettes  . Smokeless tobacco: Never Used  . Alcohol use 0.0 oz/week     Comment: 2 beers daily  . Drug use: No  . Sexual activity: Yes    Partners: Female   Other Topics Concern  . None   Social History Narrative   Single, no children.   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    Some college education--currently taking a break as of 07/2012.   Exercises occasionally.  No dietary restrictions.   Hospital Course: 25 year old man with anxiety, ADHD who was brought to ED under IVC for danger to self. Per IVC, patient drank alcohol, cut his wrist and ran into the woods. He states that he is here involuntarily after slitting his both wrists to get attention from his mother. He reports frustration about his mother who is paranoid stating that there is conspiracy while he has tried to help with his mother. He states that there was no way she would listen to him although she says that she loves him. He drank 2.5 bottles of wine at that time and states he made wrong decision while  he was drunk. He adamantly denies any SI and denies any suicide attempt. He has been drinking alcohol 6-12 beers 4 times per week. He does not think he has problems with alcohol, stating that he would do fine as long as he is away from his mother. Although he denies any interest in going to residential program, he is interested in having outpatient follow up. He is hoping to go back home. He states he feels "neutral" today. He denies SI/previous suicide attempt. He denies Hi/Ah/VH. He denies any withdrawal in the past and declines to take Librium for detox.    [facility], Wilber OliphantCaleb was evaluated & his presenting symptoms identified. His lab results showed a blood  alcohol level of 122. However, he was not presenting with any substance withdrawal symptoms. He did not receive any detoxification treatments as result. He stated on admission that he was frustrated with his mother, cut his wrist to get his mother's attention. He denied being suicidal or had hx of suicide attempts. He did admit to drinking a lot of alcohol on that day, but denies having a drinking problems.  He was rather enrolled & encouraged to participate in the unit programming, he did & learned coping skills. He presented no other significant health issues that required treatment or monitoring.   During his hospital stay, he was evaluated on daily basis by the clinical providers to assure his response to his treatment regimen.As his treatment progressed, improvement was noted as evidenced by his report of decreasing symptoms, improved mood, presentation of good affect, medication tolerance & active participation in the unit programming. He was encouraged to update his providers on his progress by daily completion of a self inventory assessment, noting mood, mental status, pain, any new symptoms, anxiety and or concerns.  Tavis's symptoms responded well to his treatment regimen combined with a therapeutic & supportive environment. He was motivated for recovery as evidenced by a positive/appropriate behavior & his interaction with the staff & fellow patients.He also worked closely with the treatment team and case manager to develop a discharge plan with appropriate goals to maintain mood stability after discharge.   Upon his hospital discharge, Wilber OliphantCaleb was in much improved condition than upon admission.His symptoms were reported as significantly decreased or resolved completely. He adamantly denies any SI/HI,  AVH, delusional thoughts & or paranoia. He was motivated to continue taking medication with a goal of continued improvement in mental health. He will continue psychiatric care on an outpatient basis  as noted below. He is provided with all the necessary information required to make this appointment without problems.  [facility] in no apparent distress. Transportation per his arrangement.  Physical Findings: AIMS: Facial and Oral Movements Muscles of Facial Expression: None, normal Lips and Perioral Area: None, normal Jaw: None, normal Tongue: None, normal,Extremity Movements Upper (arms, wrists, hands, fingers): None, normal Lower (legs, knees, ankles, toes): None, normal, Trunk Movements Neck, shoulders, hips: None, normal, Overall Severity Severity of abnormal movements (highest score from questions above): None, normal Incapacitation due to abnormal movements: None, normal Patient's awareness of abnormal movements (rate only patient's report): No Awareness, Dental Status Current problems with teeth and/or dentures?: No Does patient usually wear dentures?: No  CIWA:  CIWA-Ar Total: 0 COWS:     Musculoskeletal: Strength & Muscle Tone: within normal limits Gait & Station: normal Patient leans: N/A  Psychiatric Specialty Exam: Physical Exam  Constitutional: He appears well-developed.  HENT:  Head: Normocephalic.  Eyes: Pupils are equal, round, and reactive to light.  Neck: Normal range of motion.  Cardiovascular: Normal rate.   Respiratory: Effort normal.  GI: Soft.  Genitourinary:  Genitourinary Comments: Denies any issues in this area  Musculoskeletal: Normal range of motion.  Neurological: He is alert.  Skin: Skin is warm.    Review of Systems  Constitutional: Negative.   HENT: Negative.   Eyes: Negative.   Respiratory: Negative.   Cardiovascular: Negative.   Gastrointestinal: Negative.   Genitourinary: Negative.   Musculoskeletal: Negative.   Skin: Negative.   Neurological: Negative.   Endo/Heme/Allergies: Negative.   Psychiatric/Behavioral: Positive for depression (Stable) and substance abuse (Alcoholism, chronic). Negative  for hallucinations, memory loss and suicidal ideas. The patient has insomnia (Stable). The patient is not nervous/anxious.     Blood pressure 124/70, pulse 82, temperature 98.1 F (36.7 C), temperature source Oral, resp. rate 18, height 6' (1.829 m), weight 74.4 kg (164 lb), SpO2 100 %.Body mass index is 22.24 kg/m.  See Md's SRA  Have you used any form of tobacco in the last 30 days? (Cigarettes, Smokeless Tobacco, Cigars, and/or Pipes): Yes  Has this patient used any form of tobacco in the last 30 days? (Cigarettes, Smokeless Tobacco, Cigars, and/or Pipes):  No  Blood Alcohol level:  Lab Results  Component Value Date   ETH 122 (H) 05/15/2016   Metabolic Disorder Labs:  No results found for: HGBA1C, MPG No results found for: PROLACTIN No results found for: CHOL, TRIG, HDL, CHOLHDL, VLDL, LDLCALC  See Psychiatric Specialty Exam and Suicide Risk Assessment completed by Attending Physician prior to discharge.  Discharge destination:  Home  Is patient on multiple antipsychotic therapies at discharge:  No   Has Patient had three or more failed trials of antipsychotic monotherapy by history:  No  Recommended Plan for Multiple Antipsychotic Therapies: NA    Medication List    STOP taking these medications   dextroamphetamine 15 MG 24 hr capsule Commonly known as:  DEXEDRINE   fluticasone 0.05 % cream Commonly known as:  CUTIVATE     TAKE these medications     Indication  calamine lotion Apply topically 4 (four) times daily as needed for itching.  Indication:  Itching   hydrOXYzine 25 MG tablet Commonly known as:  ATARAX/VISTARIL Take 1 tablet (25 mg total) by mouth every 4 (four) hours as needed for itching.  Indication:  Itching   PARoxetine 20 MG tablet Commonly known as:  PAXIL Take 1 tablet (20 mg total) by mouth daily. For depression What changed:  additional instructions  Indication:  Major Depressive Disorder   traZODone 50 MG tablet Commonly known as:   DESYREL Take 1 tablet (50 mg total) by mouth at bedtime as needed for sleep.  Indication:  Trouble Sleeping      Follow-up Information    [facility] Follow up on 05/28/2016.   Why:  Hospital discharge follow up appointment on Sept 5th at 3:30 PM w Dr Milinda Cave.  Please call to cancel/reschedule if needed.  Bring hospital discharge paperwork to this appointment.   Contact information: [address] [place] [address]  Phone: [phone] Fax:  [phone]          Follow-up recommendations: Activity:  As tolerated Diet: As recommended by your primary care doctor. Keep all scheduled follow-up appointments as recommended.   Comments: Patient is instructed prior to discharge to: Take all medications as prescribed by his/her mental healthcare  provider. Report any adverse effects and or reactions from the medicines to his/her outpatient provider promptly. Patient has been instructed & cautioned: To not engage in alcohol and or illegal drug use while on prescription medicines. In the event of worsening symptoms, patient is instructed to call the crisis hotline, 911 and or go to the nearest ED for appropriate evaluation and treatment of symptoms. To follow-up with his/her primary care provider for your other medical issues, concerns and or health care needs.   Signed: Sanjuana Kava, NP, PMHNP, FNP-BC 05/20/2016, 9:50 AM

## 2016-05-20 NOTE — Tx Team (Signed)
Interdisciplinary Treatment and Diagnostic Plan Update  05/20/2016 Time of Session: 9:30am Tracey HarriesCaleb M Clerk MRN: [id number]  Principal Diagnosis: Alcohol-induced mood disorder (HCC)  Secondary Diagnoses: Principal Problem:   Alcohol-induced mood disorder (HCC) Active Problems:   Substance induced mood disorder (HCC)   Current Medications:  No current facility-administered medications for this encounter.    Current Outpatient Prescriptions  Medication Sig Dispense Refill  . calamine lotion Apply topically 4 (four) times daily as needed for itching. 120 mL 0  . hydrOXYzine (ATARAX/VISTARIL) 25 MG tablet Take 1 tablet (25 mg total) by mouth every 4 (four) hours as needed for itching. 60 tablet 0  . PARoxetine (PAXIL) 20 MG tablet Take 1 tablet (20 mg total) by mouth daily. For depression 30 tablet 0  . traZODone (DESYREL) 50 MG tablet Take 1 tablet (50 mg total) by mouth at bedtime as needed for sleep. 30 tablet 0    PTA Medications: No prescriptions prior to admission.    Treatment Modalities: Medication Management, Group therapy, Case management,  1 to 1 session with clinician, Psychoeducation, Recreational therapy.   Physician Treatment Plan for Primary Diagnosis: Alcohol-induced mood disorder (HCC) Long Term Goal(s): Improvement in symptoms so as ready for discharge  Short Term Goals: Ability to demonstrate self-control will improve, Ability to identify and develop effective coping behaviors will improve and Ability to identify triggers associated with substance abuse/mental health issues will improve  Medication Management: Evaluate patient's response, side effects, and tolerance of medication regimen.  Therapeutic Interventions: 1 to 1 sessions, Unit Group sessions and Medication administration.  Evaluation of Outcomes: Adequate for Discharge  Physician Treatment Plan for Secondary Diagnosis: Principal Problem:   Alcohol-induced mood disorder (HCC) Active Problems:    Substance induced mood disorder (HCC)   Long Term Goal(s): Improvement in symptoms so as ready for discharge  Short Term Goals: Ability to verbalize feelings will improve, Ability to identify and develop effective coping behaviors will improve and Ability to identify triggers associated with substance abuse/mental health issues will improve  Medication Management: Evaluate patient's response, side effects, and tolerance of medication regimen.  Therapeutic Interventions: 1 to 1 sessions, Unit Group sessions and Medication administration.  Evaluation of Outcomes: Adequate for Discharge   RN Treatment Plan for Primary Diagnosis: Alcohol-induced mood disorder (HCC) Long Term Goal(s): Knowledge of disease and therapeutic regimen to maintain health will improve  Short Term Goals: Ability to remain free from injury will improve, Ability to demonstrate self-control and Ability to identify and develop effective coping behaviors will improve  Medication Management: RN will administer medications as ordered by provider, will assess and evaluate patient's response and provide education to patient for prescribed medication. RN will report any adverse and/or side effects to prescribing provider.  Therapeutic Interventions: 1 on 1 counseling sessions, Psychoeducation, Medication administration, Evaluate responses to treatment, Monitor vital signs and CBGs as ordered, Perform/monitor CIWA, COWS, AIMS and Fall Risk screenings as ordered, Perform wound care treatments as ordered.  Evaluation of Outcomes: Adequate for Discharge   LCSW Treatment Plan for Primary Diagnosis: Alcohol-induced mood disorder (HCC) Long Term Goal(s): Safe transition to appropriate next level of care at discharge, Engage patient in therapeutic group addressing interpersonal concerns.  Short Term Goals: Increase ability to appropriately verbalize feelings, Increase emotional regulation and Identify triggers associated with mental  health/substance abuse issues  Therapeutic Interventions: Assess for all discharge needs, 1 to 1 time with Social worker, Explore available resources and support systems, Assess for adequacy in community support network, Educate family and  significant other(s) on suicide prevention, Complete Psychosocial Assessment, Interpersonal group therapy.  Evaluation of Outcomes: Adequate for Discharge   Progress in Treatment: Attending groups: Minimally Participating in groups: Minimally Taking medication as prescribed: Yes, MD continues to assess for medication changes as needed Toleration medication: Yes, no side effects reported at this time Family/Significant other contact made: Yes, CSW spoke with mother Patient understands diagnosis: Minimal insight AEB minimizing alcohol use and danger of self-inflicted wounds Discussing patient identified problems/goals with staff: Yes Medical problems stabilized or resolved: Yes Denies suicidal/homicidal ideation: Yes Issues/concerns per patient self-inventory: None Other: N/A  New problem(s) identified: None identified at this time.   New Short Term/Long Term Goal(s): None identified at this time.   Discharge Plan or Barriers: Pt will return home and follow-up with PCP; declines referral for psychiatric and substance abuse services  Reason for Continuation of Hospitalization: Depression Medication stabilization Suicidal ideation  Estimated Length of Stay: Discharge anticipated for today 05/20/16  Attendees: Patient: 05/20/2016    Physician: Dr. Vanetta Shawl 05/20/2016    Nursing: Joslyn Devon, Antoine Primas, Perrin Maltese, RN 05/20/2016    RN Care Manager: Onnie Boer, RN 05/20/2016    Social Worker:  05/20/2016    Recreational Therapist:  05/20/2016     05/20/2016    Other:  05/20/2016    Other: 05/20/2016      Scribe for Treatment Team: Samuella Bruin, LCSW Clinical Social Worker [facility] [phone]

## 2016-05-20 NOTE — Plan of Care (Signed)
Problem: Activity: Goal: Sleeping patterns will improve Outcome: Progressing Pt has been sleeping well this weekend

## 2016-05-20 NOTE — Progress Notes (Signed)
Patient spent most of day in room, came out occiasionally into day area.  Did not attend groups.  Patient inquired about discharge.  Denied physical complaints.  Reports mild anxiety.  Patient status reviewed during treatment team, received discharge orders.  Reviewed medications, discharge instructions, and follow up appointments with patient, patient verbalized understanding.  Patient denied SI, HI, and AVH.  Agrees to contact someone or 911 if he has thoughts/intent to harm self or others.    Medication scripts handed to patient.  Paperwork, AVS, SRA, and transition record handed to patient.   Escorted off of unit at 1625. Belongings returned per belongings form.  Discharged to lobby where he met his ride.  To follow up per AVS.

## 2016-05-20 NOTE — Progress Notes (Signed)
  [facility] Discharge Plan :  Will you be returning to the same living situation after discharge:  Yes,  patient plans to return home At discharge, do you have transportation home?: Yes,  mother will pick up Do you have the ability to pay for your medications: Yes,  patient provided with prescriptions  Release of information consent forms completed and in the chart;  Patient's signature needed at discharge.  Patient to Follow up at: Follow-up Information    [facility]/2016.   Why:  Hospital discharge follow up appointment on Sept 5th at 3:30 PM w Dr Milinda CaveMcGowen.  Please call to cancel/reschedule if needed.  Bring hospital discharge paperwork to this appointment.   Contact information: [address],  [place], [place] [address]  Phone: [phone] Fax:  [phone]           Next level of care provider has [facility]:yes  Safety Planning and Suicide Prevention discussed: Yes,  with patient and mother  Have you used any form of tobacco in the last 30 days? (Cigarettes, Smokeless Tobacco, Cigars, and/or Pipes): Yes  Has patient been referred to the Quitline?: Patient refused referral  Patient has been referred for addiction treatment: Yes  West CarboKristin L Ossie Beltran 05/20/2016, 10:51 PM
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
## 2016-05-20 NOTE — [facility] Suicide Risk Assessment (Signed)
[facility]icide [facility]   Principal Problem: Alcohol-induced mood disorder [facility](HCC) Discharge Diagnoses:  Patient Active Problem List   Diagnosis Date Noted  . 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 Alcohol-induced mood disorder (HCC) [F10.94] 05/16/2016  . Anxiety state [F41.1] 05/05/2015  . Seasonal allergic rhinitis [J30.2] 11/10/2014  . Rash [R21] 09/30/2013  . ADHD (attention deficit hyperactivity disorder) [F90.9] 09/25/2012  . Depression [F32.9] 08/13/2012    Total Time spent with patient: 20 minutes  Musculoskeletal: Strength & Muscle Tone: within normal limits Gait & Station: normal Patient leans: N/A  Psychiatric Specialty Exam: Review of Systems  Psychiatric/Behavioral: Positive for substance abuse. The patient is nervous/anxious.   All other systems reviewed and are negative.   Blood pressure 124/70, pulse 82, temperature 98.1 F (36.7 C), temperature source Oral, resp. rate 18, height 6' (1.829 m), weight 164 lb (74.4 kg), SpO2 100 %.Body mass index is 22.24 kg/m.  General Appearance: Fairly Groomed  Patent attorneyye Contact::  Fair  Speech:  Clear and Coherent409  Volume:  Normal  Mood:  "impatient"  Affect:  slightly restricted,  Thought Process:  Coherent and Goal Directed Perceptions: denies AH/VH  Orientation:  Full (Time, Place, and Person)  Thought Content:  Logical Perceptions: denies AH/VH  Suicidal Thoughts:  No  Homicidal Thoughts:  No  Memory:  intact to recent and remote history  Judgement:  Fair  Insight:  Present  Psychomotor Activity:  Normal  Concentration:  Good  Recall:  Good  Fund of Knowledge:Good  Language: Good  Akathisia:  No  Handed:  Right  AIMS (if indicated):     Assets:  Communication Skills Desire for Improvement  Sleep:  Number of Hours: 6.5  Cognition: WNL  ADL's:  Intact   Mental Status Per Nursing Assessment::   On Admission:  Self-harm thoughts, Self-harm behaviors  Demographic Factors:  Male,  Caucasian and Access to firearms - Agreed to give hunting guns to his mother  Loss Factors: NA  Historical Factors: Family history of suicide and Family history of mental illness or substance abuse mother attempted suicide  Risk Reduction Factors:   Employed, Positive social support and Positive therapeutic relationship  Continued Clinical Symptoms:  Depression:   Comorbid alcohol abuse/dependence  Cognitive Features That Contribute To Risk:  Closed-mindedness and Polarized thinking    Suicide Risk:  Mild:  Suicidal ideation of limited frequency, intensity, duration, and specificity.  There are no identifiable plans, no associated intent, mild dysphoria and related symptoms, good self-control (both objective and subjective assessment), few other risk factors, and identifiable protective factors, including available and accessible social support.  Follow-up Information     [facility].   Why:  Hospital discharge follow up appointment on Sept 5th at 3:30 PM w Dr Milinda CaveMcGowen.  Please call to cancel/reschedule if needed.  Bring hospital discharge paperwork to this appointment.   Contact information: [address],  [place], [place] [address]  Phone: [phone] Fax:  [phone]          Patient consistently denies any SI during this admission. Ms. Margarette CanadaDrinkard SW called his mother who is concerned about patient substance use. She would like him to have follow up appointment but did not address concerns regarding his discharge. Patient agrees to receive information for resources to be abstinent from substance use. Patient also agrees to give guns to his mother to avoid misuse (although he adamantly denies any intent to use guns as weapons  other than hunting).  Ms. Margarette Canada, SW will talk this plan  with patient's mother before discharge. Please see her note for details.   Plan Of Care/Follow-up recommendations:  Activity:  full Diet:  regular Tests:  none Other:   n/a  Neysa Hotter, MD 05/20/2016, 1:18 PM

## 2016-05-20 NOTE — Progress Notes (Signed)
Recreation Therapy Notes  Date: 05/20/16 Time: 0930 Location: 300 Hall Dayroom  Group Topic: Stress Management  Goal Area(s) Addresses:  Patient will verbalize importance of using healthy stress management.  Patient will identify positive emotions associated with healthy stress management.   Behavioral Response: Engaged  Intervention: Stress Management  Activity :  [place].  LRT introduced to the technique of guided imagery to the patients.  Patients were to follow along as LRT read script in order to participate in the the technique.  Education: Stress Management, Discharge Planning.   Education Outcome: Acknowledges edcuation/In group clarification offered/Needs additional education  Clinical Observations/Feedback: Pt attended group.    [name], LRT/CTRS    Nicholas Wu A 05/20/2016 12:44 PM 

## 2016-05-24 DIAGNOSIS — S62109A Fracture of unspecified carpal bone, unspecified wrist, initial encounter for closed fracture: Secondary | ICD-10-CM

## 2016-05-24 HISTORY — DX: Fracture of unspecified carpal bone, unspecified wrist, initial encounter for closed fracture: S62.109A
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
## 2016-05-28 ENCOUNTER — Ambulatory Visit: Payer: Self-pay | Admitting: Family Medicine

## 2016-07-11 ENCOUNTER — Ambulatory Visit (INDEPENDENT_AMBULATORY_CARE_PROVIDER_SITE_OTHER): Payer: Commercial Managed Care - HMO | Admitting: Family Medicine

## 2016-07-11 ENCOUNTER — Encounter: Payer: Self-pay | Admitting: Family Medicine

## 2016-07-11 VITALS — BP 145/85 | HR 85 | Temp 98.0°F | Resp 16 | Wt 175.8 lb

## 2016-07-11 DIAGNOSIS — F3342 Major depressive disorder, recurrent, in full remission: Secondary | ICD-10-CM | POA: Diagnosis not present

## 2016-07-11 DIAGNOSIS — F909 Attention-deficit hyperactivity disorder, unspecified type: Secondary | ICD-10-CM

## 2016-07-11 DIAGNOSIS — R030 Elevated blood-pressure reading, without diagnosis of hypertension: Secondary | ICD-10-CM | POA: Diagnosis not present

## 2016-07-11 MED ORDER — DEXTROAMPHETAMINE SULFATE ER 15 MG PO CP24: ORAL_CAPSULE | ORAL | 0 refills | Status: DC
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
## 2016-07-11 NOTE — Progress Notes (Signed)
Pre visit review using our clinic review tool, if applicable. No additional management support is needed unless otherwise documented below in the visit note. 

## 2016-10-08 ENCOUNTER — Ambulatory Visit (INDEPENDENT_AMBULATORY_CARE_PROVIDER_SITE_OTHER): Payer: Commercial Managed Care - HMO | Admitting: Family Medicine

## 2016-10-08 ENCOUNTER — Encounter: Payer: Self-pay | Admitting: Family Medicine

## 2016-10-08 VITALS — BP 126/78 | HR 126 | Temp 98.6°F | Resp 16 | Ht 72.0 in | Wt 172.5 lb

## 2016-10-08 DIAGNOSIS — F909 Attention-deficit hyperactivity disorder, unspecified type: Secondary | ICD-10-CM

## 2016-10-08 DIAGNOSIS — B9789 Other viral agents as the cause of diseases classified elsewhere: Secondary | ICD-10-CM | POA: Diagnosis not present

## 2016-10-08 DIAGNOSIS — J069 Acute upper respiratory infection, unspecified: Secondary | ICD-10-CM | POA: Diagnosis not present

## 2016-10-08 MED ORDER — DEXTROAMPHETAMINE SULFATE ER 15 MG PO CP24: ORAL_CAPSULE | ORAL | 0 refills | Status: DC

## 2016-10-08 NOTE — Progress Notes (Signed)
OFFICE VISIT  10/08/2016   CC:  Chief Complaint  Patient presents with  . URI    x 5 days  . Follow-up    RCI   HPI:    Patient is a 26 y.o. Caucasian male who presents for respiratory symptoms and 3 mo f/u adult ADHD. Onset 5d/a, nasal congestion, sinus pressure, PND, very little cough.  No fever.  No pain in upper teeth or face. L ear ringing.  No body aches.  No wheezing.  No otc cold med.  Today he feels a little bit better. He drank 8-9 beers last night.  All is stable with his focus/concentration/motivation--doing well on daily dexedrine.  Mood is stable on paxil. He has not taken his dexedrine today---last took this yesterday.  Past Medical History:  Diagnosis Date  . ADHD (attention deficit hyperactivity disorder)   . Anxiety and depression   . 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 Mack Hook with [facility] plans to do surgery as of 10/01/13.  . 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 History of shingles   . Tobacco dependence   . Wrist fracture, closed 05/2016   left    Past Surgical History:  Procedure Laterality Date  . NO PAST SURGERIES      Outpatient Medications Prior to Visit  Medication Sig Dispense Refill  . PARoxetine (PAXIL) 20 MG tablet Take 1 tablet (20 mg total) by mouth daily. For depression 30 tablet 0  . dextroamphetamine (DEXEDRINE SPANSULE) 15 MG 24 hr capsule 2 caps po qAM 60 capsule 0  . calamine lotion Apply topically 4 (four) times daily as needed for itching. (Patient not taking: Reported on 10/08/2016) 120 mL 0  . hydrOXYzine (ATARAX/VISTARIL) 25 MG tablet Take 1 tablet (25 mg total) by mouth every 4 (four) hours as needed for itching. (Patient not taking: Reported on 10/08/2016) 60 tablet 0  . traZODone (DESYREL) 50 MG tablet Take 1 tablet (50 mg total) by mouth at bedtime as needed for sleep. (Patient not taking: Reported on 10/08/2016) 30 tablet 0   No facility-administered  medications prior to visit.     Allergies  Allergen Reactions  . Nasonex [Mometasone] Other (See Comments)    Nasal irritation/nosebleeds    ROS As per HPI  PE: Blood pressure 126/78, pulse (!) 126, temperature 98.6 F (37 C), temperature source Oral, resp. rate 16, height 6' (1.829 m), weight 172 lb 8 oz (78.2 kg), SpO2 96 %. Repeat bp 126/78 VS: noted--normal. Gen: alert, NAD, NONTOXIC APPEARING. HEENT: eyes without injection, drainage, or swelling.  Ears: EACs clear, TMs with normal light reflex and landmarks.  Nose: Clear rhinorrhea, with some dried, crusty exudate adherent to mildly injected mucosa.  No purulent d/c.  No paranasal sinus TTP.  No facial swelling.  Throat and mouth without focal lesion.  No pharyngial swelling, erythema, or exudate.   Neck: supple, no LAD.   LUNGS: CTA bilat, nonlabored resps.   CV: RRR, no m/r/g. EXT: no c/c/e SKIN: no rash   LABS:  none  IMPRESSION AND PLAN:  1) Viral URI; showing signs of getting better the last 12-24h. Continue neti pot.  He is not interested in any other symptomatic care.  2) Adult ADHD: The current medical regimen is effective;  continue present plan and medications. I printed rx's for dexedrine 24h cap 15 mg, 2 caps po qAM, #60 today for this month, Feb 2018, and Mar 2018.  Appropriate  fill on/after date was noted on each rx.  3) Elevated bp w/out dx of HTN: repeat was normal.  Tachycardic as well.  He drank heavily last night (beer). I encouraged him to REHYDRATE and avoid drinking so much beer.  An After Visit Summary was printed and given to the patient.  FOLLOW UP: Return in about 3 months (around 01/06/2017) for routine chronic illness f/u.  Signed:  Santiago BumpersPhil Meshilem Machuca, MD           10/08/2016

## 2016-10-08 NOTE — Progress Notes (Signed)
Pre visit review using our clinic review tool, if applicable. No additional management support is needed unless otherwise documented below in the visit note. 

## 2016-11-11 ENCOUNTER — Telehealth: Payer: Self-pay | Admitting: Family Medicine

## 2016-11-11 MED ORDER — AMPHETAMINE-DEXTROAMPHET ER 20 MG PO CP24: 20.0000 mg | ORAL_CAPSULE | ORAL | 0 refills | Status: DC

## 2016-11-11 MED ORDER — AMPHETAMINE-DEXTROAMPHET ER 20 MG PO CP24
20.0000 mg | ORAL_CAPSULE | ORAL | 0 refills | Status: DC
Start: 2016-11-11 — End: 2016-11-11

## 2016-11-11 NOTE — Telephone Encounter (Signed)
OK, adderall xr 20mg , 1 cap every morning. Rx for this month and rx for March printed.-thx

## 2016-11-11 NOTE — Telephone Encounter (Signed)
Pt called stating his insurance changed, he went from Tier 1 to Tier 4 of his plan and he needs some of his prescriptions "reprinted and different formularies" please call pt.  Thank you,  -LL

## 2016-11-11 NOTE — Telephone Encounter (Signed)
SW pt and he stated that his insurance prefers either extended release adderall or instant dexedrine. Please advise. Thanks.

## 2016-11-11 NOTE — Telephone Encounter (Signed)
Pt called stating his insurance changed, he went from Tier 1 to Tier 4 of his plan and he needs some of his prescriptions "reprinted and different formularies" please call pt

## 2016-11-12 NOTE — Telephone Encounter (Signed)
Tried calling NA and unable to leave a message.  

## 2016-11-12 NOTE — Telephone Encounter (Signed)
Tried calling pt, NA and unable to leave a message. Rx for Adderall has been put up front for p/u.

## 2016-11-14 NOTE — Telephone Encounter (Signed)
Pt advised and voiced understanding.   

## 2017-01-07 ENCOUNTER — Encounter: Payer: Self-pay | Admitting: Family Medicine

## 2017-01-07 ENCOUNTER — Other Ambulatory Visit (HOSPITAL_COMMUNITY)
Admission: RE | Admit: 2017-01-07 | Discharge: 2017-01-07 | Disposition: A | Discharge status: Home / Self Care | Payer: Commercial Managed Care - HMO | Source: Ambulatory Visit | Attending: Family Medicine | Admitting: Family Medicine

## 2017-01-07 ENCOUNTER — Ambulatory Visit (INDEPENDENT_AMBULATORY_CARE_PROVIDER_SITE_OTHER): Payer: Commercial Managed Care - HMO | Admitting: Family Medicine

## 2017-01-07 VITALS — BP 123/69 | HR 80 | Temp 98.4°F | Resp 16 | Ht 72.0 in | Wt 181.8 lb

## 2017-01-07 DIAGNOSIS — L089 Local infection of the skin and subcutaneous tissue, unspecified: Secondary | ICD-10-CM | POA: Diagnosis not present

## 2017-01-07 DIAGNOSIS — F909 Attention-deficit hyperactivity disorder, unspecified type: Secondary | ICD-10-CM | POA: Diagnosis not present

## 2017-01-07 DIAGNOSIS — F339 Major depressive disorder, recurrent, unspecified: Secondary | ICD-10-CM | POA: Diagnosis not present

## 2017-01-07 DIAGNOSIS — Z7251 High risk heterosexual behavior: Secondary | ICD-10-CM

## 2017-01-07 MED ORDER — AMPHETAMINE-DEXTROAMPHET ER 30 MG PO CP24: 30.0000 mg | ORAL_CAPSULE | ORAL | 0 refills | Status: DC

## 2017-01-07 NOTE — Progress Notes (Signed)
Pre visit review using our clinic review tool, if applicable. No additional management support is needed unless otherwise documented below in the visit note. 

## 2017-01-07 NOTE — Progress Notes (Signed)
OFFICE VISIT  01/07/2017   CC:  Chief Complaint  Nicholas Wu presents with  . Follow-up    ADHD    HPI:    Nicholas Wu is a 26 y.o.  male who presents for 3 mo f/u ADHD and anxiety and depression. He had been doing well on extended release dexedrine for a long time, then about 2 mo ago his insurer required him to change to either adderall xr or IR dexedrine. I chose to put him on adderall xr  qAM at that time.  Feels like concentration, focus, motivation all diminished on this med at this dose compared to dexedrine ER  qd. Says it lasts 10 hours per day.  No adverse side effects from it.  Mood: says it has been fine, "great lately".  No signif depression, no anhedonia, no isolation type behavior. Playing guitar a lot, not currently in a band. 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  Two mo hx of intermittent feeling of swelling/pimple in outer portion of EAC.  Sometimes he squeezes it and it drains and resolves.  He asks for STD screening tests today, saying "I just want to make sure I'm healthy".  Denies penile d/c or dysuria. He is heterosexual.  Past Medical History:  Diagnosis Date  . ADHD (attention deficit hyperactivity disorder)   . Anxiety and depression   . 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 Mack Hook with [facility] plans to do surgery as of 10/01/13.  . 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 History of shingles   . Tobacco dependence   . Wrist fracture, closed 05/2016   left    Past Surgical History:  Procedure Laterality Date  . NO PAST SURGERIES      Outpatient Medications Prior to Visit  Medication Sig Dispense Refill  . PARoxetine (PAXIL) 20 MG tablet Take 1 tablet (20 mg total) by mouth daily. For depression 30 tablet 0  . amphetamine-dextroamphetamine (ADDERALL XR) 20 MG 24 hr capsule Take 1 capsule (20 mg total) by mouth every morning. Fill on/after 12/08/16 30 capsule 0    No facility-administered medications prior to visit.     Allergies  Allergen Reactions  . Nasonex [Mometasone] Other (See Comments)    Nasal irritation/nosebleeds    ROS As per HPI  PE: Blood pressure 123/69, pulse 80, temperature 98.4 F (36.9 C), temperature source Oral, resp. rate 16, height 6' (1.829 m), weight 181 lb 12 oz (82.4 kg), SpO2 100 %. Wt Readings from Last 2 Encounters:  01/07/17 181 lb 12 oz (82.4 kg)  10/08/16 172 lb 8 oz (78.2 kg)    Gen: alert, oriented x 4, affect pleasant.  Lucid thinking and conversation noted. HEENT: PERRLA, EOMI.  R inner tragus region with faint pink plaque at site of old papule lesion.  No tenderness. Neck: no LAD, mass, or thyromegaly. CV: RRR, no m/r/g LUNGS: CTA bilat, nonlabored. NEURO: no tremor or tics noted on observation.  Coordination intact. CN 2-12 grossly intact bilaterally, strength 5/5 in all extremeties.  No ataxia.   LABS:  Lab Results  Component Value Date   TSH 3.363 05/18/2016   Lab Results  Component Value Date   WBC 10.9 (H) 05/15/2016   HGB 15.2 05/15/2016   HCT 44.3 05/15/2016   MCV 87.9 05/15/2016   PLT 252 05/15/2016   Lab Results  Component Value Date   CREATININE 1.04 05/15/2016   BUN 12  05/15/2016   NA 136 05/15/2016   K 4.2 05/15/2016   CL 106 05/15/2016   CO2 23 05/15/2016   Lab Results  Component Value Date   ALT 26 05/15/2016   AST 28 05/15/2016   ALKPHOS 68 05/15/2016   BILITOT 0.6 05/15/2016    IMPRESSION AND PLAN:  1) Adult ADHD: not quite optimal control on adderall xr  qd. Will increase dosing to adderall xr  qd. I printed rx's for adderall xr , 1 q AM, #30 today for this month, May 2018, and June 2018.  Appropriate fill on/after date was noted on each rx.  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 Apply bactroban ointment tid x 10d if this recurs.  He says he already has some of this from a past rx I gave him.  3) High risk sexual  behavior: pt desires STD screening.  HIV w/reflex, RPR, and GC/chlamydia urine testing done today.  4) Depression: The current medical regimen is effective;  continue present plan and medications.  An After Visit Summary was printed and given to the Nicholas Wu.  FOLLOW UP: Return in about 3 months (around 04/08/2017) for routine chronic illness f/u.  Signed:  Santiago Bumpers, MD           01/07/2017

## 2017-01-08 LAB — HIV ANTIBODY (ROUTINE TESTING W REFLEX): HIV: NONREACTIVE

## 2017-01-08 LAB — URINE CYTOLOGY ANCILLARY ONLY
CHLAMYDIA, DNA PROBE: NEGATIVE
Neisseria Gonorrhea: NEGATIVE

## 2017-01-09 LAB — RPR

## 2017-01-20 ENCOUNTER — Encounter: Payer: Self-pay | Admitting: *Deleted

## 2017-01-24 ENCOUNTER — Encounter: Payer: Self-pay | Admitting: *Deleted

## 2017-02-05 ENCOUNTER — Other Ambulatory Visit: Payer: Self-pay | Admitting: Family Medicine

## 2017-02-05 NOTE — Telephone Encounter (Signed)
[facility] [place]  RF request for paroxetine LOV: 01/07/17 Next ov: None Last written: 05/20/16 #30 w/ 0RF

## 2017-03-10 ENCOUNTER — Encounter: Payer: Self-pay | Admitting: Family Medicine

## 2017-03-10 ENCOUNTER — Ambulatory Visit (INDEPENDENT_AMBULATORY_CARE_PROVIDER_SITE_OTHER): Payer: BLUE CROSS/BLUE SHIELD | Admitting: Family Medicine

## 2017-03-10 VITALS — BP 143/72 | HR 81 | Temp 98.7°F | Resp 16 | Ht 72.0 in | Wt 182.5 lb

## 2017-03-10 DIAGNOSIS — B360 Pityriasis versicolor: Secondary | ICD-10-CM | POA: Diagnosis not present

## 2017-03-10 MED ORDER — KETOCONAZOLE 200 MG PO TABS: ORAL_TABLET | ORAL | 0 refills | Status: DC

## 2017-03-10 NOTE — Progress Notes (Signed)
OFFICE VISIT  03/10/2017   CC:  Chief Complaint  Patient presents with  . Rash    chest and back   HPI:    Patient is a 26 y.o. Caucasian male who presents for rash. "Comes out every summer when I sweat a lot".  Says that this has been a problem on and off, one time it went away for about 3 yrs when an MD rx'd him systemic treatment.  He was told it was tinea at that time. It itches ever so slightly sometimes.  He has been applying lamisil bid x 1-2 wks without improvement.  Past Medical History:  Diagnosis Date  . ADHD (attention deficit hyperactivity disorder)   . Anxiety and depression   . 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 Mack Hookavid Thompson with [facility] plans to do surgery as of 10/01/13.  . 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 History of shingles   . Tobacco dependence   . Wrist fracture, closed 05/2016   left    Past Surgical History:  Procedure Laterality Date  . NO PAST SURGERIES      Outpatient Medications Prior to Visit  Medication Sig Dispense Refill  . amphetamine-dextroamphetamine (ADDERALL XR) 30 MG 24 hr capsule Take 1 capsule (30 mg total) by mouth every morning. 30 capsule 0  . PARoxetine (PAXIL) 20 MG tablet TAKE 1 TABLET BY MOUTH DAILY 30 tablet 6   No facility-administered medications prior to visit.     Allergies  Allergen Reactions  . Nasonex [Mometasone] Other (See Comments)    Nasal irritation/nosebleeds    ROS As per HPI  PE: Blood pressure (!) 143/72, pulse 81, temperature 98.7 F (37.1 C), temperature source Oral, resp. rate 16, height 6' (1.829 m), weight 182 lb 8 oz (82.8 kg), SpO2 98 %. Gen: Alert, well appearing.  Patient is oriented to person, place, time, and situation. AFFECT: pleasant, lucid thought and speech. Skin of chest and back: multiple, small to medium sized, well demarcated macules that are a darker pigment than his normal skin.  These do blanch some.  On his back,  there are large areas where the lesions coalesce. No vesicles, pustules, erosions/maceration, no petechiae.  Scant amount of very superficial peeling on some lesions.  LABS:  none  IMPRESSION AND PLAN:  Rash; suspect tinea (pityriasis) versicolor. Ketoconazole 200 mg qd x 10d. Try to avoid remaining sweaty for long periods.  An After Visit Summary was printed and given to the patient.  FOLLOW UP: Return if symptoms worsen or fail to improve.  Signed:  Santiago BumpersPhil Cristela Stalder, MD           03/10/2017

## 2017-04-01 ENCOUNTER — Ambulatory Visit (INDEPENDENT_AMBULATORY_CARE_PROVIDER_SITE_OTHER): Payer: BLUE CROSS/BLUE SHIELD | Admitting: Family Medicine

## 2017-04-01 ENCOUNTER — Encounter: Payer: Self-pay | Admitting: Family Medicine

## 2017-04-01 VITALS — BP 129/75 | HR 91 | Temp 98.3°F | Resp 16 | Ht 72.0 in | Wt 182.2 lb

## 2017-04-01 DIAGNOSIS — F909 Attention-deficit hyperactivity disorder, unspecified type: Secondary | ICD-10-CM

## 2017-04-01 DIAGNOSIS — B360 Pityriasis versicolor: Secondary | ICD-10-CM | POA: Diagnosis not present

## 2017-04-01 MED ORDER — AMPHETAMINE-DEXTROAMPHET ER 30 MG PO CP24: 30.0000 mg | ORAL_CAPSULE | ORAL | 0 refills | Status: DC

## 2017-04-01 NOTE — Progress Notes (Signed)
OFFICE VISIT  04/01/2017   CC:  Chief Complaint  Patient presents with  . Follow-up    RCI   HPI:    Patient is a 26 y.o. Caucasian male who presents for 3 mo f/u ADHD and depression. Had been on dexedrin extended release long term until recently when his insurance formulary changed and we had to switch him to adderall XR 30 mg qAM. Also, has been on paxil long term for help with depression.  Recent tinea versicolor rash is totally gone s/p recent treatment with ketoconazole orally.  Doing well on adderall XR 30mg  qaM---focus, concentration, motivation.  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  Past Medical History:  Diagnosis Date  . ADHD (attention deficit hyperactivity disorder)   . Anxiety and depression   . 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 Mack Hookavid Thompson with [facility] plans to do surgery as of 10/01/13.  . 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 History of shingles   . Tinea versicolor    Responded completely to ketoconazole oral (2018)  . Tobacco dependence   . Wrist fracture, closed 05/2016   left    Past Surgical History:  Procedure Laterality Date  . NO PAST SURGERIES      Outpatient Medications Prior to Visit  Medication Sig Dispense Refill  . PARoxetine (PAXIL) 20 MG tablet TAKE 1 TABLET BY MOUTH DAILY 30 tablet 6  . amphetamine-dextroamphetamine (ADDERALL XR) 30 MG 24 hr capsule Take 1 capsule (30 mg total) by mouth every morning. 30 capsule 0  . ketoconazole (NIZORAL) 200 MG tablet 1 tab po qd x 10d (Patient not taking: Reported on 04/01/2017) 10 tablet 0   No facility-administered medications prior to visit.     Allergies  Allergen Reactions  . Nasonex [Mometasone] Other (See Comments)    Nasal irritation/nosebleeds    ROS As per HPI  PE: Blood pressure 129/75, pulse 91, temperature 98.3 F (36.8 C), temperature source Oral, resp. rate 16, height 6' (1.829 m), weight 182 lb 4 oz  (82.7 kg), SpO2 100 %. Wt Readings from Last 2 Encounters:  04/01/17 182 lb 4 oz (82.7 kg)  03/10/17 182 lb 8 oz (82.8 kg)    Gen: alert, oriented x 4, affect pleasant.  Lucid thinking and conversation noted. HEENT: PERRLA, EOMI.   Neck: no LAD, mass, or thyromegaly. CV: RRR, no m/r/g LUNGS: CTA bilat, nonlabored. NEURO: no tremor or tics noted on observation.  Coordination intact. CN 2-12 grossly intact bilaterally, strength 5/5 in all extremeties.  No ataxia. SKIN: no rash  LABS:  none  IMPRESSION AND PLAN:  1) Adult ADD: The current medical regimen is effective;  continue present plan and medications. I printed rx's for adderall XR, 1 qd, #30 today for this month, August 2018, and Sept 2018.  Appropriate fill on/after date was noted on each rx.  2) Tinea versicolor: Completely eradicated with recent course of oral ketoconazole If this returns, apply lamisil cream bid as soon as he sees it start.    An After Visit Summary was printed and given to the patient.  FOLLOW UP: Return in about 3 months (around 07/02/2017) for f/u ADD.  Signed:  Santiago BumpersPhil McGowen, MD           04/01/2017

## 2017-06-23 ENCOUNTER — Ambulatory Visit (INDEPENDENT_AMBULATORY_CARE_PROVIDER_SITE_OTHER): Payer: BLUE CROSS/BLUE SHIELD | Admitting: Family Medicine

## 2017-06-23 ENCOUNTER — Encounter: Payer: Self-pay | Admitting: Family Medicine

## 2017-06-23 VITALS — BP 132/70 | HR 72 | Temp 98.9°F | Resp 16 | Ht 72.0 in | Wt 176.8 lb

## 2017-06-23 DIAGNOSIS — F909 Attention-deficit hyperactivity disorder, unspecified type: Secondary | ICD-10-CM

## 2017-06-23 DIAGNOSIS — F329 Major depressive disorder, single episode, unspecified: Secondary | ICD-10-CM

## 2017-06-23 DIAGNOSIS — F32A Depression, unspecified: Secondary | ICD-10-CM

## 2017-06-23 DIAGNOSIS — F419 Anxiety disorder, unspecified: Secondary | ICD-10-CM | POA: Diagnosis not present

## 2017-06-23 MED ORDER — AMPHETAMINE-DEXTROAMPHET ER 30 MG PO CP24: 30.0000 mg | ORAL_CAPSULE | ORAL | 0 refills | Status: DC

## 2017-06-23 NOTE — Progress Notes (Signed)
OFFICE VISIT  06/23/2017   CC:  Chief Complaint  Patient presents with  . Follow-up    RCI   HPI:    Patient is a 26 y.o. Caucasian male who presents for f/u adult ADD, anxiety and depression. Feeling fine.  No questions regarding meds.  No side effects. Compliant with meds.  Working full time as Programmer, applications.  Past Medical History:  Diagnosis Date  . ADHD (attention deficit hyperactivity disorder)   . Anxiety and depression   . 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 Mack Hook with [facility] plans to do surgery as of 10/01/13.  . 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 History of shingles   . Tinea versicolor    Responded completely to ketoconazole oral (2018)  . Tobacco dependence   . Wrist fracture, closed 05/2016   left    Past Surgical History:  Procedure Laterality Date  . NO PAST SURGERIES      Outpatient Medications Prior to Visit  Medication Sig Dispense Refill  . PARoxetine (PAXIL) 20 MG tablet TAKE 1 TABLET BY MOUTH DAILY 30 tablet 6  . amphetamine-dextroamphetamine (ADDERALL XR) 30 MG 24 hr capsule Take 1 capsule (30 mg total) by mouth every morning. 30 capsule 0   No facility-administered medications prior to visit.     Allergies  Allergen Reactions  . Nasonex [Mometasone] Other (See Comments)    Nasal irritation/nosebleeds    ROS As per HPI  PE: Blood pressure 132/70, pulse 72, temperature 98.9 F (37.2 C), temperature source Oral, resp. rate 16, height 6' (1.829 m), weight 176 lb 12 oz (80.2 kg), SpO2 97 %. Wt Readings from Last 2 Encounters:  06/23/17 176 lb 12 oz (80.2 kg)  04/01/17 182 lb 4 oz (82.7 kg)    Gen: alert, oriented x 4, affect pleasant.  Lucid thinking and conversation noted. HEENT: PERRLA, EOMI.   Neck: no LAD, mass, or thyromegaly. CV: RRR, no m/r/g LUNGS: CTA bilat, nonlabored. NEURO: no tremor or tics noted on observation.  Coordination intact. CN 2-12 grossly intact  bilaterally, strength 5/5 in all extremeties.  No ataxia.   LABS:    Chemistry      Component Value Date/Time   NA 136 05/15/2016 1457   K 4.2 05/15/2016 1457   CL 106 05/15/2016 1457   CO2 23 05/15/2016 1457   BUN 12 05/15/2016 1457   CREATININE 1.04 05/15/2016 1457      Component Value Date/Time   CALCIUM 8.6 (L) 05/15/2016 1457   ALKPHOS 68 05/15/2016 1457   AST 28 05/15/2016 1457   ALT 26 05/15/2016 1457   BILITOT 0.6 05/15/2016 1457      IMPRESSION AND PLAN:  Adult ADD + chronic/stable anxiety and depression: I printed rx's for adderall xr , 1 qd, #30 today for this month, Nov, and Dec 2018.  Appropriate fill on/after date was noted on each rx. He has enough paxil for right now.  An After Visit Summary was printed and given to the patient.  FOLLOW UP: Return in about 3 months (around 09/23/2017) for routine chronic illness f/u.  Signed:  Santiago Bumpers, MD           06/23/2017

## 2017-06-24 ENCOUNTER — Ambulatory Visit: Payer: Self-pay | Admitting: Family Medicine

## 2017-08-27 ENCOUNTER — Other Ambulatory Visit: Payer: Self-pay | Admitting: Family Medicine

## 2017-09-24 ENCOUNTER — Encounter: Payer: Self-pay | Admitting: Family Medicine

## 2017-09-24 ENCOUNTER — Ambulatory Visit: Payer: BLUE CROSS/BLUE SHIELD | Admitting: Family Medicine

## 2017-09-24 VITALS — BP 122/74 | HR 92 | Temp 97.3°F | Resp 16 | Ht 72.0 in | Wt 174.0 lb

## 2017-09-24 DIAGNOSIS — F900 Attention-deficit hyperactivity disorder, predominantly inattentive type: Secondary | ICD-10-CM

## 2017-09-24 DIAGNOSIS — R030 Elevated blood-pressure reading, without diagnosis of hypertension: Secondary | ICD-10-CM

## 2017-09-24 DIAGNOSIS — F3342 Major depressive disorder, recurrent, in full remission: Secondary | ICD-10-CM

## 2017-09-24 MED ORDER — AMPHETAMINE-DEXTROAMPHET ER 30 MG PO CP24: 30.0000 mg | ORAL_CAPSULE | ORAL | 0 refills | Status: DC

## 2017-09-24 NOTE — Progress Notes (Signed)
OFFICE VISIT  09/24/2017   CC:  Chief Complaint  Patient presents with  . Follow-up    RCI    HPI:    Patient is a 27 y.o. Caucasian male who presents for 3 mo f/u ADHD and hx of depression that has been in remission. Doing well on adderall XR 30mg  qAM:  Focus and concentration and task completion all good.  No signif frustration. Mood is fine, anxiety level stable.  Taking paxil 20mg  qd. No sudafed today, no caffeine today.  No pain currently. No hx of home bp or pharmacy bp monitoring.  No herbal meds.  ROS: no HA, no vision c/o, no CP, no dizziness, no SOB, no LE swelling, no gross hematuria.  Past Medical History:  Diagnosis Date  . ADHD (attention deficit hyperactivity disorder)   . Anxiety and depression   . 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 Mack Hookavid Thompson with [facility] plans to do surgery as of 10/01/13.  . 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 History of shingles   . Tinea versicolor    Responded completely to ketoconazole oral (2018)  . Tobacco dependence   . Wrist fracture, closed 05/2016   left    Past Surgical History:  Procedure Laterality Date  . NO PAST SURGERIES      Outpatient Medications Prior to Visit  Medication Sig Dispense Refill  . PARoxetine (PAXIL) 20 MG tablet TAKE 1 TABLET ONCE DAILY. 30 tablet 5  . amphetamine-dextroamphetamine (ADDERALL XR) 30 MG 24 hr capsule Take 1 capsule (30 mg total) by mouth every morning. 30 capsule 0   No facility-administered medications prior to visit.     Allergies  Allergen Reactions  . Nasonex [Mometasone] Other (See Comments)    Nasal irritation/nosebleeds    ROS As per HPI  PE: Initial bp today was 153/80 Blood pressure 122/74, pulse 92, temperature (!) 97.3 F (36.3 C), temperature source Oral, resp. rate 16, height 6' (1.829 m), weight 174 lb (78.9 kg), SpO2 97 %. Wt Readings from Last 2 Encounters:  09/24/17 174 lb (78.9 kg)  06/23/17  176 lb 12 oz (80.2 kg)    Gen: alert, oriented x 4, affect pleasant.  Lucid thinking and conversation noted. HEENT: PERRLA, EOMI.   Neck: no LAD, mass, or thyromegaly. CV: RRR, no m/r/g LUNGS: CTA bilat, nonlabored. NEURO: no tremor or tics noted on observation.  Coordination intact. CN 2-12 grossly intact bilaterally, strength 5/5 in all extremeties.  No ataxia.   LABS:    Chemistry      Component Value Date/Time   NA 136 05/15/2016 1457   K 4.2 05/15/2016 1457   CL 106 05/15/2016 1457   CO2 23 05/15/2016 1457   BUN 12 05/15/2016 1457   CREATININE 1.04 05/15/2016 1457      Component Value Date/Time   CALCIUM 8.6 (L) 05/15/2016 1457   ALKPHOS 68 05/15/2016 1457   AST 28 05/15/2016 1457   ALT 26 05/15/2016 1457   BILITOT 0.6 05/15/2016 1457      IMPRESSION AND PLAN:  1) ADHD: The current medical regimen is effective;  continue present plan and medications. I printed rx's for adderall XR 30mg , 1 qAM, #30 today for this month, Feb 2019, and March 2019.  Appropriate fill on/after date was noted on each rx.  2) MDD, in remission.  Pt wishes to stay on paxil 20mg  indefinitely.  Stable at this time.  An After Visit Summary  was printed and given to the patient.  FOLLOW UP: Return in about 3 months (around 12/23/2017) for routine chronic illness f/u.  Signed:  Santiago Bumpers, MD           09/24/2017

## 2017-10-30 ENCOUNTER — Ambulatory Visit: Payer: Self-pay

## 2017-10-30 NOTE — Telephone Encounter (Signed)
  Reason for Disposition . [1] Rectal bleeding is minimal (e.g., blood just on toilet paper, few drops, streaks on surface of normal formed BM) AND [2] bleeding recurs 3 or more times on treatment  Protocols used: RECTAL BLEEDING-A-AH

## 2017-10-30 NOTE — Telephone Encounter (Addendum)
Patient call in with c/o "blood in stool." He says "I've had this going on about 2 months ago, but consistent the past 3 days. It's blood streaks on the stool." I asked is he constipated, he said "yes, for the past month. I'm normally not constipated, so I don't know what is going on with that." He denies diarrhea, dizziness, fever. He says the abdominal pain he's add for a long time is still the same. According to protocol, see PCP within 3 days, patient offered appointment tomorrow, but requests Monday or Tuesday, appointment made for Monday, 11/03/17 with Dr. Milinda CaveMcGowen, care advice given, patient verbalized understanding.   Reason for Disposition . MILD rectal bleeding (more than just a few drops or streaks)  Answer Assessment - Initial Assessment Questions 1. APPEARANCE of BLOOD: "What color is it?" "Is it passed separately, on the surface of the stool, or mixed in with the stool?"      Bright red today 2. AMOUNT: "How much blood was passed?"      Streaks 3. FREQUENCY: "How many times has blood been passed with the stools?"      3 times this week and couple times in past 2 months 4. ONSET: "When was the blood first seen in the stools?" (Days or weeks)      This week 5. DIARRHEA: "Is there also some diarrhea?" If so, ask: "How many diarrhea stools were passed in past 24 hours?"      No 6. CONSTIPATION: "Do you have constipation?" If so, "How bad is it?"     Yes lately in the past month 7. RECURRENT SYMPTOMS: "Have you had blood in your stools before?" If so, ask: "When was the last time?" and "What happened that time?"      Yes-1 year ago; stopped drinking because I thought it was an ulcer 8. BLOOD THINNERS: "Do you take any blood thinners?" (e.g., Coumadin/warfarin, Pradaxa/dabigatran, aspirin)     No 9. OTHER SYMPTOMS: "Do you have any other symptoms?"  (e.g., abdominal pain, vomiting, dizziness, fever)     Abdominal pain-the same I've had for a long time 10. PREGNANCY: "Is there any  chance you are pregnant?" "When was your last menstrual period?"       N/A  Protocols used: RECTAL BLEEDING-A-AH

## 2017-11-03 ENCOUNTER — Ambulatory Visit: Payer: Self-pay | Admitting: Family Medicine

## 2017-11-10 ENCOUNTER — Ambulatory Visit: Payer: Self-pay | Admitting: Family Medicine

## 2017-11-11 ENCOUNTER — Ambulatory Visit: Payer: Self-pay | Admitting: Family Medicine

## 2017-11-12 ENCOUNTER — Encounter: Payer: Self-pay | Admitting: Family Medicine

## 2017-11-12 ENCOUNTER — Other Ambulatory Visit (HOSPITAL_COMMUNITY)
Admission: RE | Admit: 2017-11-12 | Discharge: 2017-11-12 | Disposition: A | Discharge status: Home / Self Care | Payer: BLUE CROSS/BLUE SHIELD | Source: Ambulatory Visit | Attending: Family Medicine | Admitting: Family Medicine

## 2017-11-12 ENCOUNTER — Ambulatory Visit: Payer: BLUE CROSS/BLUE SHIELD | Admitting: Family Medicine

## 2017-11-12 VITALS — BP 131/84 | HR 81 | Temp 98.7°F | Wt 174.0 lb

## 2017-11-12 DIAGNOSIS — N410 Acute prostatitis: Secondary | ICD-10-CM

## 2017-11-12 DIAGNOSIS — R1030 Lower abdominal pain, unspecified: Secondary | ICD-10-CM | POA: Diagnosis not present

## 2017-11-12 DIAGNOSIS — K921 Melena: Secondary | ICD-10-CM | POA: Insufficient documentation

## 2017-11-12 DIAGNOSIS — R1011 Right upper quadrant pain: Secondary | ICD-10-CM | POA: Diagnosis not present

## 2017-11-12 LAB — CBC WITH DIFFERENTIAL/PLATELET
Basophils Absolute: 0 10*3/uL (ref 0.0–0.1)
Basophils Relative: 0.6 % (ref 0.0–3.0)
EOS PCT: 3.3 % (ref 0.0–5.0)
Eosinophils Absolute: 0.2 10*3/uL (ref 0.0–0.7)
HEMATOCRIT: 43.9 % (ref 39.0–52.0)
HEMOGLOBIN: 14.8 g/dL (ref 13.0–17.0)
Lymphocytes Relative: 37.7 % (ref 12.0–46.0)
Lymphs Abs: 2 10*3/uL (ref 0.7–4.0)
MCHC: 33.8 g/dL (ref 30.0–36.0)
MCV: 86.3 fl (ref 78.0–100.0)
MONOS PCT: 8.8 % (ref 3.0–12.0)
Monocytes Absolute: 0.5 10*3/uL (ref 0.1–1.0)
Neutro Abs: 2.7 10*3/uL (ref 1.4–7.7)
Neutrophils Relative %: 49.6 % (ref 43.0–77.0)
Platelets: 291 10*3/uL (ref 150.0–400.0)
RBC: 5.09 Mil/uL (ref 4.22–5.81)
RDW: 13.5 % (ref 11.5–15.5)
WBC: 5.4 10*3/uL (ref 4.0–10.5)

## 2017-11-12 LAB — URINALYSIS, ROUTINE W REFLEX MICROSCOPIC
BILIRUBIN URINE: NEGATIVE
Hgb urine dipstick: NEGATIVE
Ketones, ur: NEGATIVE
LEUKOCYTES UA: NEGATIVE
Nitrite: NEGATIVE
PH: 6.5 (ref 5.0–8.0)
RBC / HPF: NONE SEEN (ref 0–?)
Specific Gravity, Urine: 1.01 (ref 1.000–1.030)
TOTAL PROTEIN, URINE-UPE24: NEGATIVE
URINE GLUCOSE: NEGATIVE
UROBILINOGEN UA: 0.2 (ref 0.0–1.0)

## 2017-11-12 LAB — COMPREHENSIVE METABOLIC PANEL
ALBUMIN: 4.5 g/dL (ref 3.5–5.2)
ALK PHOS: 56 U/L (ref 39–117)
ALT: 14 U/L (ref 0–53)
AST: 14 U/L (ref 0–37)
BUN: 11 mg/dL (ref 6–23)
CALCIUM: 9.8 mg/dL (ref 8.4–10.5)
CO2: 31 mEq/L (ref 19–32)
Chloride: 101 mEq/L (ref 96–112)
Creatinine, Ser: 0.93 mg/dL (ref 0.40–1.50)
GFR: 103.53 mL/min (ref >60.00)
Glucose, Bld: 88 mg/dL (ref 70–99)
POTASSIUM: 4.3 meq/L (ref 3.5–5.1)
Sodium: 138 mEq/L (ref 135–145)
TOTAL PROTEIN: 7.2 g/dL (ref 6.0–8.3)
Total Bilirubin: 0.7 mg/dL (ref 0.2–1.2)

## 2017-11-12 LAB — SEDIMENTATION RATE: SED RATE: 5 mm/h (ref 0–15)

## 2017-11-12 MED ORDER — SULFAMETHOXAZOLE-TRIMETHOPRIM 800-160 MG PO TABS: 1.0000 | ORAL_TABLET | Freq: Two times a day (BID) | ORAL | 0 refills | Status: DC

## 2017-11-12 NOTE — Progress Notes (Signed)
OFFICE VISIT  11/12/2017   CC:  Chief Complaint  Patient presents with  . Blood In Stools    x 3 months   HPI:    Patient is a 27 y.o. Caucasian male who presents for "blood in stool". Onset approx 3-4 wks ago, multiple times per week he sees streaks of blood in his BMs.  BM's are formed, but had 1d of loose BMs that were "mucousy" 4 d/a and since then no blood.  No melena.  BMs don't alleviate sx's or cause them.  Nothing is made worse by eating. Drinking alcohol/beer upsets lower abd. Has some discomfort in prostate area lately on/off, some intermittent difficulty urinating/pressure to urinate, some urgency/frequency.  Blood in urine (red) when urinated recently.  Some incomplete emptying. No fevers but feels "hot" in afternoons sometimes. No n/v.  No upper abd pain.  Doesn't take NSAIDs much at all.  Some constipation in last couple months on/off. No anal pain, no feeling of hemorrhoid. No wt loss.  Appetite is good.  Past Medical History:  Diagnosis Date  . ADHD (attention deficit hyperactivity disorder)   . Anxiety and depression   . 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 Milly Jakob with [facility] plans to do surgery as of 10/01/13.  . 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 History of shingles   . Tinea versicolor    Responded completely to ketoconazole oral (2018)  . Tobacco dependence   . Wrist fracture, closed 05/2016   left    Past Surgical History:  Procedure Laterality Date  . NO PAST SURGERIES      Outpatient Medications Prior to Visit  Medication Sig Dispense Refill  . amphetamine-dextroamphetamine (ADDERALL XR) 30 MG 24 hr capsule Take 1 capsule (30 mg total) by mouth every morning. 30 capsule 0  . PARoxetine (PAXIL) 20 MG tablet TAKE 1 TABLET ONCE DAILY. 30 tablet 5   No facility-administered medications prior to visit.     Allergies  Allergen Reactions  . Nasonex [Mometasone] Other (See Comments)     Nasal irritation/nosebleeds    ROS As per HPI  PE: Blood pressure 131/84, pulse 81, temperature 98.7 F (37.1 C), temperature source Oral, weight 174 lb (78.9 kg), SpO2 100 %. Gen: Alert, well appearing.  Patient is oriented to person, place, time, and situation. AFFECT: pleasant, lucid thought and speech. WCH:ENID: no injection, icteris, swelling, or exudate.  EOMI, PERRLA. Mouth: lips without lesion/swelling.  Oral mucosa pink and moist. Oropharynx without erythema, exudate, or swelling.  CV: RRR, no m/r/g.   LUNGS: CTA bilat, nonlabored resps, good aeration in all lung fields. ABD: soft, nondistended, BS normal.  No HSM or mass.  +Mild TTP in midepigastrum, RUQ and umbilical region--mostly causes a pressure that makes him want to urinate. EXT: no clubbing, cyanosis, or edema. Marland Kitchen SKIN: no rash or jaundice or pallor.  LABS:  Lab Results  Component Value Date   WBC 10.9 (H) 05/15/2016   HGB 15.2 05/15/2016   HCT 44.3 05/15/2016   MCV 87.9 05/15/2016   PLT 252 05/15/2016     Chemistry      Component Value Date/Time   NA 136 05/15/2016 1457   K 4.2 05/15/2016 1457   CL 106 05/15/2016 1457   CO2 23 05/15/2016 1457   BUN 12 05/15/2016 1457   CREATININE 1.04 05/15/2016 1457      Component Value Date/Time   CALCIUM 8.6 (L) 05/15/2016 1457  ALKPHOS 68 05/15/2016 1457   AST 28 05/15/2016 1457   ALT 26 05/15/2016 1457   BILITOT 0.6 05/15/2016 1457      IMPRESSION AND PLAN:  Subacute, intermittent hematochezia. Accompanied by intermittent lower abd discomfort + urinary obstruction sx's and question of gross hematuria. Pt with remote history of ascariasis.  Pt has not seen any worm in stool with these current sx's. Check CBC w/diff, CMET, ESR, UA, urine GC/Chl, obtain stool sample for giard/crypt, o&P, bact culture, fecal lactoferrin, hemoccult. Empiric abx trial for possible acute prostatitis: bactrim DS 1 bid x 14d. Of note, he saw Dr. Fuller Plan in GI 12/2014 for similar  various abd complaints and CT abd/pelv was done and it showed only prominent stool throughout colon--favors constipation.  Some mild dextroconvenx thoracolumbar scoliosis was also noted.  Will consider repeat imaging vs referral back to Dr. Fuller Plan if pt not improved at f/u visit in 2 wks.  An After Visit Summary was printed and given to the patient.  FOLLOW UP: 2 wks  Signed:  Crissie Sickles, MD           11/12/2017

## 2017-11-14 LAB — URINE CULTURE
MICRO NUMBER:: 90229447
Result:: NO GROWTH
SPECIMEN QUALITY:: ADEQUATE

## 2017-11-14 LAB — URINE CYTOLOGY ANCILLARY ONLY
CHLAMYDIA, DNA PROBE: NEGATIVE
Neisseria Gonorrhea: NEGATIVE

## 2017-11-16 LAB — GIARDIA/CRYPTOSPORIDIUM (EIA)
MICRO NUMBER:: 90241366
MICRO NUMBER:: 90241367
RESULT: NOT DETECTED
RESULT:: NOT DETECTED
SPECIMEN QUALITY: ADEQUATE
SPECIMEN QUALITY:: ADEQUATE

## 2017-11-16 LAB — TIQ-NTM

## 2017-11-16 LAB — FECAL LACTOFERRIN, QUANT
Fecal Lactoferrin: NEGATIVE
MICRO NUMBER: 90239972
SPECIMEN QUALITY:: ADEQUATE

## 2017-11-18 ENCOUNTER — Telehealth: Payer: Self-pay | Admitting: Family Medicine

## 2017-11-18 ENCOUNTER — Other Ambulatory Visit: Payer: Self-pay | Admitting: *Deleted

## 2017-11-18 DIAGNOSIS — K921 Melena: Secondary | ICD-10-CM

## 2017-11-18 DIAGNOSIS — R1084 Generalized abdominal pain: Secondary | ICD-10-CM

## 2017-11-18 LAB — STOOL CULTURE
MICRO NUMBER: 90239981
MICRO NUMBER: 90239982
MICRO NUMBER: 90239983
SHIGA RESULT: NOT DETECTED
SPECIMEN QUALITY: ADEQUATE
SPECIMEN QUALITY: ADEQUATE
SPECIMEN QUALITY:: ADEQUATE

## 2017-11-18 NOTE — Telephone Encounter (Signed)
No recollection needed. Thanks.

## 2017-11-18 NOTE — Telephone Encounter (Signed)
[facility] states they can not perform the Ova and parasite as the collection tube has changed from [facility].  Please advise if you would like this to be recollected.

## 2017-11-20 LAB — OVA AND PARASITE EXAMINATION
CONCENTRATE RESULT: NONE SEEN
SPECIMEN QUALITY: ADEQUATE
TRICHROME RESULT: NONE SEEN
VKL: 90249969

## 2017-12-19 ENCOUNTER — Ambulatory Visit: Payer: Self-pay | Admitting: Family Medicine

## 2017-12-19 ENCOUNTER — Ambulatory Visit: Payer: BLUE CROSS/BLUE SHIELD | Admitting: Family Medicine

## 2017-12-19 ENCOUNTER — Encounter: Payer: Self-pay | Admitting: Family Medicine

## 2017-12-19 VITALS — BP 105/69 | HR 90 | Temp 99.4°F | Resp 16 | Ht 72.0 in | Wt 169.5 lb

## 2017-12-19 DIAGNOSIS — L568 Other specified acute skin changes due to ultraviolet radiation: Secondary | ICD-10-CM

## 2017-12-19 MED ORDER — AMPHETAMINE-DEXTROAMPHET ER 30 MG PO CP24: 30.0000 mg | ORAL_CAPSULE | ORAL | 0 refills | Status: DC

## 2017-12-19 NOTE — Progress Notes (Signed)
OFFICE VISIT  12/19/2017   CC:  Chief Complaint  Patient presents with   Follow-up    RCI   HPI:    Patient is a 27 y.o. Caucasian male who presents for 3 mo f/u adult ADD. Doing well. Pt states all is going well with the med at current dosing: much improved focus, concentration, task completion.  Less frustration, better multitasking, less impulsivity and restlessness.  Mood is stable. No side effects from the medication.  Mood: doing fine on paxil.  NO side effects from adderall or paxil.  Has rash across nose and tops of both cheeks.  Has been on bridge of nose for a couple years, just extended to cheeks last 2-3 mo.  No itching.  No burning/pain.  Feels warm to the touch.  Steroid cream of his mom's helps significantly when taking it about 2d.  No persistent problems with joint pains except constantly in ankles.  NO swelling or redness.  No injury in the past. Hips occ hurt--about 2 times per month.   Question of this rash being related to past ingestion of antifungal med, sulfa abx, antidepressant, NSAIDs, sunscreen.    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 Mack Hookavid Thompson with [facility] plans to do surgery as of 10/01/13.   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 30 capsule 0   PARoxetine (PAXIL) 20 MG tablet TAKE 1 TABLET ONCE DAILY. 30 tablet 5   sulfamethoxazole-trimethoprim (BACTRIM  DS,SEPTRA DS) 800-160 MG tablet Take 1 tablet by mouth 2 (two) times daily. (Patient not taking: Reported on 12/19/2017) 28 tablet 0   No facility-administered medications prior to visit.     Allergies  Allergen Reactions   Nasonex [Mometasone] Other (See Comments)    Nasal irritation/nosebleeds    ROS As per HPI  PE: Blood pressure 105/69, pulse 90, temperature 99.4 F (37.4 C), temperature source Oral, resp. rate 16, height 6' (1.829 m), weight 169 lb 8 oz (76.9 kg), SpO2 100 %.   LABS:    IMPRESSION AND PLAN:  Adult ADD:  The current medical regimen is effective;  continue present plan and medications. I printed rx's for  today for .  Appropriate fill on/after date was noted on each rx.  Controlled substance contract reviewed with patient today.  Patient signed this and it will be placed in the chart.    An After Visit Summary was printed and given to the patient.  FOLLOW UP: No follow-ups on file.

## 2017-12-19 NOTE — Patient Instructions (Signed)
Apply only hypoallergenic sunscreen (no pigments or fragrance additive). Don't apply anymore topical steroid to the rash. I will refer you to a dermatologist for further evaluation.

## 2017-12-22 ENCOUNTER — Ambulatory Visit: Payer: Self-pay | Admitting: Family Medicine

## 2017-12-23 ENCOUNTER — Encounter: Payer: Self-pay | Admitting: Family Medicine

## 2017-12-29 ENCOUNTER — Encounter: Payer: Self-pay | Admitting: Gastroenterology

## 2017-12-29 ENCOUNTER — Ambulatory Visit: Payer: BLUE CROSS/BLUE SHIELD | Admitting: Gastroenterology

## 2017-12-29 VITALS — BP 118/88 | HR 126 | Ht 70.75 in | Wt 167.2 lb

## 2017-12-29 DIAGNOSIS — R1011 Right upper quadrant pain: Secondary | ICD-10-CM

## 2017-12-29 DIAGNOSIS — R079 Chest pain, unspecified: Secondary | ICD-10-CM

## 2017-12-29 DIAGNOSIS — K921 Melena: Secondary | ICD-10-CM | POA: Diagnosis not present

## 2017-12-29 MED ORDER — SUPREP BOWEL PREP KIT 17.5-3.13-1.6 GM/177ML PO SOLN: 1.0000 | ORAL | 0 refills | Status: DC

## 2017-12-29 NOTE — Progress Notes (Signed)
    History of Present Illness: This is a 27 year old male with intermittent RUQ abdominal pain, Right chest pain, hematochezia, mucous in stool.  He also notes occasional episodes of diarrhea occurring about twice per month and at other times his bowel movements are described as normal, once daily.  He notes the diarrhea that seems to occur for no specific reason he cannot correlate diarrhea with food or beverage.  He denies constipation.  Right upper quadrant and right chest pain is not associated with meals or foods he states it occurs intermittently for no specific reason.  This pain symptom has been occurring intermittently for a few years and has not changed.  Abd/pelvic CT 01/2015 IMPRESSION: 1. The patient has mild dextroconvex thoracolumbar scoliosis. This might predispose to right-sided stitch if the patient is exhaling when planting the right foot while running. Correlate with whether exhale on left foot plant while running relieves symptoms. 2.  Prominent stool throughout the colon favors constipation.   Current Medications, Allergies, Past Medical History, Past Surgical History, Family History and Social History were reviewed in [facility] electronic medical record.  Physical Exam: General: Well developed, well nourished, no acute distress Head: Normocephalic and atraumatic Eyes:  sclerae anicteric, EOMI Ears: Normal auditory acuity Mouth: No deformity or lesions Lungs: Clear throughout to auscultation Heart: Regular rate and rhythm; no murmurs, rubs or bruits Abdomen: Soft, non tender and non distended. No masses, hepatosplenomegaly or hernias noted. Normal Bowel sounds Rectal: Intermittent hematochezia, as per rectumdeferred to colonoscopy  Musculoskeletal: Symmetrical with no gross deformities  Pulses:  Normal pulses noted Extremities: No clubbing, cyanosis, edema or deformities noted Neurological: Alert oriented x 4, grossly nonfocal Psychological:  Alert and  cooperative. Normal mood and affect  Assessment and Recommendations:  1.  Hematochezia, mucus per rectum, diarrhea - all intermittent.  Rule out hemorrhoids, less likely IBD and much less likely colorectal neoplasms.  Schedule colonoscopy. The risks (including bleeding, perforation, infection, missed lesions, medication reactions and possible hospitalization or surgery if complications occur), benefits, and alternatives to colonoscopy with possible biopsy and possible polypectomy were discussed with the patient and they consent to proceed.   2. RUQ pain, R costal pain.  These symptoms do not appear to be GI related.  Trial of dicyclomine 10 mg 3 times daily as needed.

## 2017-12-29 NOTE — Addendum Note (Signed)
Addended by: Junius RoadsWILLIAMS, Merve Hotard J on: 12/29/2017 02:47 PM   Modules accepted: Orders

## 2017-12-29 NOTE — Patient Instructions (Signed)
If you are age 27 or older, your body mass index should be between 23-30. Your Body mass index is 23.49 kg/m. If this is out of the aforementioned range listed, please consider follow up with your Primary Care Provider.  If you are age 27 or younger, your body mass index should be between 19-25. Your Body mass index is 23.49 kg/m. If this is out of the aformentioned range listed, please consider follow up with your Primary Care Provider.   You have been scheduled for a colonoscopy. Please follow written instructions given to you at your visit today.  Please pick up your prep supplies at the pharmacy within the next 1-3 days. If you use inhalers (even only as needed), please bring them with you on the day of your procedure. Your physician has requested that you go to www.[email or web address].com and enter the access code given to you at your visit today. This web site gives a general overview about your procedure. However, you should still follow specific instructions given to you by our office regarding your preparation for the procedure.

## 2018-01-12 ENCOUNTER — Encounter: Payer: Self-pay | Admitting: Gastroenterology

## 2018-01-12 ENCOUNTER — Encounter: Payer: Self-pay | Admitting: Family Medicine

## 2018-01-14 ENCOUNTER — Ambulatory Visit: Payer: Self-pay

## 2018-01-14 NOTE — Telephone Encounter (Signed)
Need to see him in office.

## 2018-01-14 NOTE — Telephone Encounter (Signed)
Tried calling pt, NA and unable to leave a message.  

## 2018-01-14 NOTE — Telephone Encounter (Signed)
Pt. "poked" his eye with a stick Monday while clearing brush. Today eye is red,swollen, vision is blurred. Painful. Please advise pt. In regard to treatment.  Answer Assessment - Initial Assessment Questions 1. MECHANISM: "How did the injury happen?"      Monday - poked eye with stick 2. ONSET: "When did the injury happen?" (Minutes or hours ago)      Monday 3. LOCATION: "What part of the eye is injured?" (cornea, sclera, eyelid, or periorbital tissue)     Middle 4. APPEARANCE: "What does the eye look like?"      Reddened  5. VISION: "Is the vision blurred?"      Vision is blurry 6. PAIN: "Is it painful?" If so, ask: "How bad is the pain?"   (e.g., Scale 1-10; or mild, moderate, severe)     5 7. SIZE: For cuts, bruises, or swelling, ask: "How large is it?" (e.g., inches or centimeters)      Eye lid is swollen 8. TETANUS: For any breaks in the skin, ask: "When was the last tetanus booster?"     2017 9. CONTACTS: "Do you wear contacts?"     No 10. OTHER SYMPTOMS: "Do you have any other symptoms?" (e.g., headache, neck pain, vomiting)       No 11. PREGNANCY: "Is there any chance you are pregnant?" "When was your last menstrual period?"       No  Protocols used: EYE INJURY-A-AH

## 2018-01-14 NOTE — Telephone Encounter (Signed)
Please advise. Thanks.  

## 2018-01-14 NOTE — Telephone Encounter (Signed)
  Reason for Disposition . Eyelids swollen shut  Protocols used: EYE INJURY-A-AH

## 2018-01-14 NOTE — Telephone Encounter (Signed)
Pt has apt with Dr. Milinda CaveMcGowen on 01/15/18 at 8:00am.

## 2018-01-15 ENCOUNTER — Ambulatory Visit: Payer: Self-pay | Admitting: Family Medicine

## 2018-01-26 ENCOUNTER — Encounter: Payer: Self-pay | Admitting: Gastroenterology

## 2018-01-26 ENCOUNTER — Telehealth: Payer: Self-pay | Admitting: Gastroenterology

## 2018-01-26 NOTE — Telephone Encounter (Signed)
Charge for late cancellation.  

## 2018-01-26 NOTE — Telephone Encounter (Signed)
Patient canceled procedure for today 5.6.19 stating due to financial reasons. Patient rescheduled to 6.27.19.

## 2018-02-23 ENCOUNTER — Other Ambulatory Visit: Payer: Self-pay | Admitting: Family Medicine

## 2018-02-26 NOTE — Telephone Encounter (Signed)
Tried calling, NA and unable to leave a message due to vm box not being set up yet. 

## 2018-03-02 NOTE — Telephone Encounter (Signed)
Tried calling, NA and unable to leave a message due to vm box not being set up yet. 

## 2018-03-02 NOTE — Telephone Encounter (Signed)
Letter mailed to address in EMR. 

## 2018-03-18 ENCOUNTER — Telehealth: Payer: Self-pay | Admitting: Gastroenterology

## 2018-03-18 NOTE — Telephone Encounter (Signed)
Please charge for late [facility] cancellation.  ?

## 2018-03-19 ENCOUNTER — Encounter: Payer: Self-pay | Admitting: Gastroenterology

## 2018-03-20 ENCOUNTER — Ambulatory Visit: Payer: BLUE CROSS/BLUE SHIELD | Admitting: Family Medicine

## 2018-03-20 ENCOUNTER — Encounter: Payer: Self-pay | Admitting: Family Medicine

## 2018-03-20 VITALS — BP 119/64 | HR 69 | Temp 98.7°F | Resp 16 | Ht 70.75 in | Wt 173.4 lb

## 2018-03-20 DIAGNOSIS — F988 Other specified behavioral and emotional disorders with onset usually occurring in childhood and adolescence: Secondary | ICD-10-CM | POA: Diagnosis not present

## 2018-03-20 DIAGNOSIS — R21 Rash and other nonspecific skin eruption: Secondary | ICD-10-CM

## 2018-03-20 DIAGNOSIS — Z79899 Other long term (current) drug therapy: Secondary | ICD-10-CM

## 2018-03-20 DIAGNOSIS — F3342 Major depressive disorder, recurrent, in full remission: Secondary | ICD-10-CM

## 2018-03-20 MED ORDER — PAROXETINE HCL 20 MG PO TABS: 20.0000 mg | ORAL_TABLET | Freq: Every day | ORAL | 3 refills | Status: DC

## 2018-03-20 MED ORDER — AMPHETAMINE-DEXTROAMPHET ER 30 MG PO CP24: 30.0000 mg | ORAL_CAPSULE | ORAL | 0 refills | Status: DC

## 2018-03-20 NOTE — Progress Notes (Signed)
OFFICE VISIT  03/20/2018   CC:  Chief Complaint  Patient presents with  . Follow-up    RCI     HPI:    Patient is a 27 y.o. Caucasian male who presents for 3 mo f/u adult ADD and hx of mood disorder in remission.  Pt states all is going well with the med at current dosing: much improved focus, concentration, task completion.  Less frustration, better multitasking, less impulsivity and restlessness.  Mood is stable. No side effects from the medication. 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 Takes med daily.  Mood: depression stable---no relapse in sx's.  Has his recurrent rash on his back that he gets every summer--asks for derm referral.  Past Medical History:  Diagnosis Date  . ADHD (attention deficit hyperactivity disorder)   . Anxiety and depression   . 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 Milly Jakob with [facility] plans to do surgery as of 10/01/13.  . 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 Hematochezia    Dr. Fuller Plan recommended colonoscopy 12/2017  . History of shingles   . Tinea versicolor    Responded completely to ketoconazole oral (2018)  . Tobacco dependence   . Wrist fracture, closed 05/2016   left    Past Surgical History:  Procedure Laterality Date  . NO PAST SURGERIES      Outpatient Medications Prior to Visit  Medication Sig Dispense Refill  . amphetamine-dextroamphetamine (ADDERALL XR) 30 MG 24 hr capsule Take 1 capsule (30 mg total) by mouth every morning. 30 capsule 0  . PARoxetine (PAXIL) 20 MG tablet TAKE 1 TABLET ONCE DAILY. 30 tablet 0  . SUPREP BOWEL PREP KIT 17.5-3.13-1.6 GM/177ML SOLN Take 1 kit by mouth as directed. (Patient not taking: Reported on 03/20/2018) 354 mL 0   No facility-administered medications prior to visit.     Allergies  Allergen Reactions  . Nasonex [Mometasone] Other (See Comments)    Nasal irritation/nosebleeds    ROS As per  HPI  PE: Blood pressure 119/64, pulse 69, temperature 98.7 F (37.1 C), temperature source Oral, resp. rate 16, height 5' 10.75" (1.797 m), weight 173 lb 6 oz (78.6 kg), SpO2 96 %. Body mass index is 24.35 kg/m.  Wt Readings from Last 2 Encounters:  03/20/18 173 lb 6 oz (78.6 kg)  12/29/17 167 lb 4 oz (75.9 kg)    Gen: alert, oriented x 4, affect pleasant.  Lucid thinking and conversation noted. HEENT: PERRLA, EOMI.   Neck: no LAD, mass, or thyromegaly. CV: RRR, no m/r/g LUNGS: CTA bilat, nonlabored. NEURO: no tremor or tics noted on observation.  Coordination intact. CN 2-12 grossly intact bilaterally, strength 5/5 in all extremeties.  No ataxia. Skin: back with scattered small light brown macules with slight superficial flaky texture.  LABS:    Chemistry      Component Value Date/Time   NA 138 11/12/2017 1026   K 4.3 11/12/2017 1026   CL 101 11/12/2017 1026   CO2 31 11/12/2017 1026   BUN 11 11/12/2017 1026   CREATININE 0.93 11/12/2017 1026      Component Value Date/Time   CALCIUM 9.8 11/12/2017 1026   ALKPHOS 56 11/12/2017 1026   AST 14 11/12/2017 1026   ALT 14 11/12/2017 1026   BILITOT 0.7 11/12/2017 1026       IMPRESSION AND PLAN:  1) Adult ADD: The current medical regimen is effective;  continue present  plan and medications. I printed rx's for adderall 30 mg, 1 qd, #30 today for July 2019, Aug 2019, and Sept 2019.  Appropriate fill on/after date was noted on each rx. 7/2, 8/1, 8/31. [facility] UTD. UDS today (adderall should show up).  2) MDD, recurrent, in remission: The current medical regimen is effective;  continue present plan and medications. RFd med today.  3) Recurrent rash on back: suspect tinea versicolor---pt requests referral to derm so I ordered this today.  An After Visit Summary was printed and given to the patient.  FOLLOW UP: Return in about 3 months (around 06/20/2018) for f/u ADD/meds.  Signed:  Crissie Sickles, MD            03/20/2018

## 2018-03-23 LAB — PAIN MGMT, PROFILE 8 W/CONF, U
6 ACETYLMORPHINE: NEGATIVE ng/mL (ref <10)
AMPHETAMINES: POSITIVE ng/mL — AB (ref <500)
Alcohol Metabolites: POSITIVE ng/mL — AB (ref <500)
Amphetamine: 7457 ng/mL — ABNORMAL HIGH (ref <250)
Benzodiazepines: NEGATIVE ng/mL (ref <100)
Buprenorphine, Urine: NEGATIVE ng/mL (ref <5)
COCAINE METABOLITE: NEGATIVE ng/mL (ref <150)
Creatinine: 167.6 mg/dL
ETHYL GLUCURONIDE (ETG): 1096 ng/mL — AB (ref <500)
Ethyl Sulfate (ETS): 672 ng/mL — ABNORMAL HIGH (ref <100)
MARIJUANA METABOLITE: NEGATIVE ng/mL (ref <20)
MDMA: NEGATIVE ng/mL (ref <500)
Methamphetamine: NEGATIVE ng/mL (ref <250)
Opiates: NEGATIVE ng/mL (ref <100)
Oxidant: NEGATIVE ug/mL (ref <200)
Oxycodone: NEGATIVE ng/mL (ref <100)
pH: 5.58 (ref 4.5–9.0)

## 2018-03-30 ENCOUNTER — Encounter: Payer: Self-pay | Admitting: Family Medicine

## 2018-06-19 ENCOUNTER — Encounter: Payer: Self-pay | Admitting: Family Medicine

## 2018-06-19 ENCOUNTER — Ambulatory Visit: Payer: BLUE CROSS/BLUE SHIELD | Admitting: Family Medicine

## 2018-06-19 VITALS — BP 127/76 | HR 74 | Temp 99.7°F | Resp 16 | Ht 70.75 in | Wt 178.2 lb

## 2018-06-19 DIAGNOSIS — F909 Attention-deficit hyperactivity disorder, unspecified type: Secondary | ICD-10-CM

## 2018-06-19 DIAGNOSIS — F3340 Major depressive disorder, recurrent, in remission, unspecified: Secondary | ICD-10-CM | POA: Diagnosis not present

## 2018-06-19 DIAGNOSIS — B360 Pityriasis versicolor: Secondary | ICD-10-CM

## 2018-06-19 DIAGNOSIS — E663 Overweight: Secondary | ICD-10-CM | POA: Diagnosis not present

## 2018-06-19 MED ORDER — AMPHETAMINE-DEXTROAMPHETAMINE 20 MG PO TABS: ORAL_TABLET | ORAL | 0 refills | Status: DC

## 2018-06-19 NOTE — Progress Notes (Signed)
OFFICE VISIT  06/19/2018   CC:  Chief Complaint  Patient presents with  . Follow-up    RCI     HPI:    Patient is a 27 y.o. Caucasian male who presents for 3 mo f/u adult ADD and hx of mood disorder in remission. Last visit was stable, all meds continued w/out any change. I referred him to derm last visit per his request--->for recurrent rash on his back x years that we think is tinea versicolor.  Last f/u he was busy helping his dad restore an old farmhouse--finished! Got a promotion at work and is working longer hours but still gets plenty of sleep every night.    Has felt gradual progression of less energy esp towards end of day.  Also less focused, "almost scatterbrained" by the end of the day, with some impaired task completion.  A bit more impulsive/irritable b/c frustrated with concentration not being as good.  No side effects from the medication. He has been on current dose of adderall for approx 3 yrs. Mood is great.  Anxiety level fluctuates/is appropriate.  He is very happy with the way his paxil helps him.  Of note, he says he used borax a few times for his tinea versicolor and it has taken care of it very well.  He did not end up going to see the dermatologist.  Past Medical History:  Diagnosis Date  . ADHD (attention deficit hyperactivity disorder)   . Anxiety and depression   . 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 Mack Hook with [facility] plans to do surgery as of 10/01/13.  . 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 Hematochezia    Dr. Russella Dar recommended colonoscopy 12/2017  . History of shingles   . Tinea versicolor    Responded completely to ketoconazole oral (2018)  . Tobacco dependence   . Wrist fracture, closed 05/2016   left    Past Surgical History:  Procedure Laterality Date  . NO PAST SURGERIES      Outpatient Medications Prior to Visit  Medication Sig Dispense Refill  . PARoxetine (PAXIL)  20 MG tablet Take 1 tablet (20 mg total) by mouth daily. 90 tablet 3  . amphetamine-dextroamphetamine (ADDERALL XR) 30 MG 24 hr capsule Take 1 capsule (30 mg total) by mouth every morning. 30 capsule 0   No facility-administered medications prior to visit.     Allergies  Allergen Reactions  . Nasonex [Mometasone] Other (See Comments)    Nasal irritation/nosebleeds    ROS As per HPI  PE: Blood pressure 127/76, pulse 74, temperature 99.7 F (37.6 C), temperature source Oral, resp. rate 16, height 5' 10.75" (1.797 m), weight 178 lb 4 oz (80.9 kg), SpO2 99 %. Wt Readings from Last 2 Encounters:  06/19/18 178 lb 4 oz (80.9 kg)  03/20/18 173 lb 6 oz (78.6 kg)    Gen: alert, oriented x 4, affect pleasant.  Lucid thinking and conversation noted. HEENT: PERRLA, EOMI.   Neck: no LAD, mass, or thyromegaly. CV: RRR, no m/r/g LUNGS: CTA bilat, nonlabored. NEURO: no tremor or tics noted on observation.  Coordination intact. CN 2-12 grossly intact bilaterally, strength 5/5 in all extremeties.  No ataxia.   LABS:  Lab Results  Component Value Date   TSH 3.363 05/18/2016   Lab Results  Component Value Date   WBC 5.4 11/12/2017   HGB 14.8 11/12/2017   HCT 43.9 11/12/2017   MCV 86.3  11/12/2017   PLT 291.0 11/12/2017   Lab Results  Component Value Date   CREATININE 0.93 11/12/2017   BUN 11 11/12/2017   NA 138 11/12/2017   K 4.3 11/12/2017   CL 101 11/12/2017   CO2 31 11/12/2017   Lab Results  Component Value Date   ALT 14 11/12/2017   AST 14 11/12/2017   ALKPHOS 56 11/12/2017   BILITOT 0.7 11/12/2017   IMPRESSION AND PLAN:  1) Adult ADHD: suspect he has developed enough tolerance to current dosing to explain his breakthrough sx's the last couple months.  Decided to increase adderall xr to 40mg  qAM (20mg  tabs, 2 every morning). I did e rx's for adderall xr 20mg , 2 tabs po qAM, #60 today for this month, Oct 2019, and Nov 2019.Marland Kitchen  Appropriate fill on/after date was noted on  each rx. CSC and UDS are UTD.  2) Recurrent MDD, in remission. Continue paxil at current dose.  3) Recurrent tinea versicolor: gone (for the time being) with use of borax---a treatment that the patient has decided (of his own volition) to use.  An After Visit Summary was printed and given to the patient.  FOLLOW UP: Return in about 3 months (around 09/18/2018) for routine chronic illness f/u.  Signed:  Santiago Bumpers, MD           06/19/2018

## 2018-06-22 ENCOUNTER — Telehealth: Payer: Self-pay | Admitting: *Deleted

## 2018-06-22 MED ORDER — AMPHETAMINE-DEXTROAMPHET ER 20 MG PO CP24: ORAL_CAPSULE | ORAL | 0 refills | Status: DC

## 2018-06-22 NOTE — Telephone Encounter (Signed)
Recevied fax from [facility] pharmacy asking about Rx for adderall that was sent on 06/19/18. Note on fax stated that pt was previously getting adderall XR but Rx sent on 06/19/18 is for adderall immediate release. Please advise. Thanks.

## 2018-06-22 NOTE — Telephone Encounter (Signed)
My fault. I just sent in new rx's for adderall xr. Pls ask the pharmacy to disregard the recent 3 electronic rx's for the "plain" adderall.-thx

## 2018-06-23 ENCOUNTER — Telehealth: Payer: Self-pay | Admitting: Family Medicine

## 2018-06-23 MED ORDER — AMPHETAMINE-DEXTROAMPHET ER 20 MG PO CP24: ORAL_CAPSULE | ORAL | 0 refills | Status: DC

## 2018-06-23 NOTE — Telephone Encounter (Signed)
OK, done (I hope).

## 2018-06-23 NOTE — Telephone Encounter (Signed)
PA sent today via CoverMyMeds.

## 2018-06-23 NOTE — Telephone Encounter (Signed)
Contacted patient and he is aware that he is taking plain adderall and is okay with that for this month.   Contacted pharmacy to cancel ONLY this months adderall XR.   When patient goes to fill adderall XR next month there will be a PA to come through as his insurance won't cover the adderall XR at 2tabs daily.

## 2018-06-23 NOTE — Telephone Encounter (Signed)
Contacted patient's pharmacy.  They received the new RX except the current RX to fill now-  They only received fill on or after 10/30 and on or after 11/30.    Please send in RX for adderall to fill now.

## 2018-06-23 NOTE — Telephone Encounter (Signed)
Apparently pt already picked up the original rx for short acting adderall 20mg  tabs that was my initial mistake/error. Therefore, he will have to take this med (adderall 20mg  short acting---not extended release) 1 tab twice per day. THEN, when he returns to pharmacy to get next rx for adderall in 1 mo, he should have adderall XR 20mg , 2 tabs po qAM ready for him to pick up.  Pls CANCEL the adderall XR rx that I just sent in today.  Hope that makes sense--thx.

## 2018-06-23 NOTE — Telephone Encounter (Signed)
Copied from CRM [id number]. Topic: General - Other >> Jun 23, 2018  4:25 PM Tamela Oddi wrote: Reason for CRM: Sam with [employer] called to get PA for patient's medication amphetamine-dextroamphetamine (ADDERALL XR) 20 MG 24 hr capsule.  Please advise.  CB# [phone].

## 2018-06-23 NOTE — Telephone Encounter (Signed)
Patient picked up "plain" adderall on 06/21/2018 she cant disregard due to being picked up. [facility] never got the Adderall XR.  Cb# [phone]

## 2018-06-24 NOTE — Telephone Encounter (Signed)
Sharita from [facility] called to get clinical information about the PA - states they need clarification on quantity.

## 2018-06-25 ENCOUNTER — Encounter: Payer: Self-pay | Admitting: *Deleted

## 2018-06-25 NOTE — Telephone Encounter (Signed)
Completed QL form from [employer], waiting on Dr. Milinda Cave to return to sign form.

## 2018-06-26 NOTE — Telephone Encounter (Signed)
Form faxed. Waiting on response.

## 2018-07-01 NOTE — Telephone Encounter (Signed)
PA approved, sent to pharmacy.

## 2018-07-16 NOTE — Telephone Encounter (Signed)
PA has already been done and approved. Letter was faxed to pharmacy.  Pt advised and voiced understanding.

## 2018-07-16 NOTE — Telephone Encounter (Signed)
Patient needs PA done for Adderal XR. Pt has enough left for 2 days

## 2018-09-18 ENCOUNTER — Ambulatory Visit: Payer: BLUE CROSS/BLUE SHIELD | Admitting: Family Medicine

## 2018-09-18 ENCOUNTER — Encounter: Payer: Self-pay | Admitting: Family Medicine

## 2018-09-18 VITALS — BP 126/80 | HR 112 | Temp 98.5°F | Resp 16 | Ht 70.0 in | Wt 176.4 lb

## 2018-09-18 DIAGNOSIS — F909 Attention-deficit hyperactivity disorder, unspecified type: Secondary | ICD-10-CM

## 2018-09-18 MED ORDER — AMPHETAMINE-DEXTROAMPHET ER 20 MG PO CP24: ORAL_CAPSULE | ORAL | 0 refills | Status: DC

## 2018-09-18 MED ORDER — AMPHETAMINE-DEXTROAMPHETAMINE 20 MG PO TABS: 40.0000 mg | ORAL_TABLET | Freq: Every day | ORAL | 0 refills | Status: DC

## 2018-09-18 NOTE — Progress Notes (Signed)
OFFICE VISIT  09/18/2018   CC:  Chief Complaint  Patient presents with  . Follow-up    RCI   HPI:    Patient is a 27 y.o. Caucasian male who presents for 3 mo f/u Adult ADD. Last f/u visit I suspected he had developed enough tolerance to current dosing to explain breakthrough sx's he had been having the last couple months.  Decided to increase adderall xr to 40mg  qAM (20mg  tabs, 2 every morning).  Kept him on TWO adderall IR/short acting 20mg  tabs q afternoon.  Pt states all is going well with the med at current dosing: much improved focus, concentration, task completion.  Less frustration, better multitasking, less impulsivity and restlessness.  Mood is stable. No side effects from the medication. Feeling much improved, motivated, more active.  No anxiety. No side effects.  He is drinking caffeine with this med as well, making his HR faster.  But he denies palpitations or heart racing or dizziness or CP.  Past Medical History:  Diagnosis Date  . ADHD (attention deficit hyperactivity disorder)   . Anxiety and depression   . 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 Mack Hookavid Thompson with [facility] plans to do surgery as of 10/01/13.  . 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 Hematochezia    Dr. Russella DarStark recommended colonoscopy 12/2017  . History of shingles   . Tinea versicolor    Responded completely to ketoconazole oral (2018)  . Tobacco dependence   . Wrist fracture, closed 05/2016   left    Past Surgical History:  Procedure Laterality Date  . NO PAST SURGERIES      Outpatient Medications Prior to Visit  Medication Sig Dispense Refill  . PARoxetine (PAXIL) 20 MG tablet Take 1 tablet (20 mg total) by mouth daily. 90 tablet 3  . amphetamine-dextroamphetamine (ADDERALL XR) 20 MG 24 hr capsule 2 caps po qAM 60 capsule 0  . amphetamine-dextroamphetamine (ADDERALL) 20 MG tablet Take 40 mg by mouth daily.     No facility-administered  medications prior to visit.     Allergies  Allergen Reactions  . Nasonex [Mometasone] Other (See Comments)    Nasal irritation/nosebleeds    ROS As per HPI  PE: Blood pressure 126/80, pulse (!) 112, temperature 98.5 F (36.9 C), temperature source Oral, resp. rate 16, height 5\' 10"  (1.778 m), weight 176 lb 6 oz (80 kg), SpO2 100 %. Wt Readings from Last 2 Encounters:  09/18/18 176 lb 6 oz (80 kg)  06/19/18 178 lb 4 oz (80.9 kg)    Gen: alert, oriented x 4, affect pleasant.  Lucid thinking and conversation noted. HEENT: PERRLA, EOMI.   Neck: no LAD, mass, or thyromegaly. CV: RRR, no m/r/g LUNGS: CTA bilat, nonlabored. NEURO: no tremor or tics noted on observation.  Coordination intact. CN 2-12 grossly intact bilaterally, strength 5/5 in all extremeties.  No ataxia.   LABS:    Chemistry      Component Value Date/Time   NA 138 11/12/2017 1026   K 4.3 11/12/2017 1026   CL 101 11/12/2017 1026   CO2 31 11/12/2017 1026   BUN 11 11/12/2017 1026   CREATININE 0.93 11/12/2017 1026      Component Value Date/Time   CALCIUM 9.8 11/12/2017 1026   ALKPHOS 56 11/12/2017 1026   AST 14 11/12/2017 1026   ALT 14 11/12/2017 1026   BILITOT 0.7 11/12/2017 1026       IMPRESSION AND  PLAN:  1) Adult ADD: doing very well on current regimen of 40mg  adderall xr qAM and 40 mg of adderall IR/short acting q afternoon.  Advised him to minimize caffeine intake with this medication. I did electronic rx's for adderall XR 20mg , 2 tabs qAM, #60, and adderall IR/short acting 20mg ,  2 q Afternoon, #60 today for this Jan, Feb, and Mar 2020.  Appropriate fill on/after date was noted on each rx. He is not going back to [place] to his normal pharmacy today so I sent ONE of his adderall 20mg  IR/short acting rx's for [facility]d supply [facility] [place].  All the rest of his adderall rx's were sent to his normal pharmacy, [facility] [place].  An After Visit Summary was printed and given to the  patient.  FOLLOW UP: Return in about 3 months (around 12/18/2018) for routine chronic illness f/u.  Signed:  Santiago BumpersPhil Anapaula Severt, MD           09/18/2018

## 2018-09-21 ENCOUNTER — Telehealth: Payer: Self-pay | Admitting: *Deleted

## 2018-09-21 NOTE — Telephone Encounter (Signed)
Received fax from pharmacy. Rx's sent on 09/18/18 were sent for qty 30. Pt takes 2 tabs daily. Please send new Rx's for qty 60. Thanks.

## 2018-09-22 ENCOUNTER — Other Ambulatory Visit: Payer: Self-pay | Admitting: Family Medicine

## 2018-09-22 MED ORDER — AMPHETAMINE-DEXTROAMPHETAMINE 20 MG PO TABS: 40.0000 mg | ORAL_TABLET | Freq: Every day | ORAL | 0 refills | Status: DC

## 2018-09-24 NOTE — Telephone Encounter (Signed)
Received fax from pts pharmacy saying that they received 2 corrected Rx's (one to be filled on/after 10/18/18 and one to be filled on/after 11/17/18) but did not receive one for pt to have now. Please advise. Thanks.

## 2018-09-24 NOTE — Telephone Encounter (Signed)
This has been taken care of.

## 2018-10-01 ENCOUNTER — Other Ambulatory Visit: Payer: Self-pay | Admitting: Family Medicine

## 2018-10-01 NOTE — Telephone Encounter (Signed)
Copied from CRM [id number]. Topic: Quick Communication - Rx Refill/Question >> Oct 01, 2018  4:52 PM Jilda Rocheemaray, Melissa wrote: Medication: amphetamine-dextroamphetamine (ADDERALL) 20 MG tablet     Has the patient contacted their pharmacy? Yes.   (Agent: If no, request that the patient contact the pharmacy for the refill.) (Agent: If yes, when and what did the pharmacy advise?)  Preferred Pharmacy (with phone number or street name): [id number] [place] [place] - [address] [phone] (Phone) [phone] (Fax)    Agent: Please be advised that RX refills may take up to 3 business days. We ask that you follow-up with your pharmacy.

## 2018-10-02 MED ORDER — AMPHETAMINE-DEXTROAMPHETAMINE 20 MG PO TABS: 40.0000 mg | ORAL_TABLET | Freq: Every day | ORAL | 0 refills | Status: DC

## 2018-10-02 NOTE — Telephone Encounter (Signed)
Per Dr. Milinda Cave please disregard message below. Rx sent on 09/22/18 was sent to [facility] but with a fill on/after date of 10/18/18. Per Dr. Milinda Cave he will [facility].  Rx sent.   Please advise. Thanks.

## 2018-10-02 NOTE — Telephone Encounter (Signed)
Oops. I redid this rx on 09/22/18, but I sent it to [facility] ! Pls check and make sure this did not get filled at [facility] before I eRx one to [facility].-thx

## 2018-10-02 NOTE — Telephone Encounter (Signed)
SW pt, he stated that at his last office visit he asked for Dr. Milinda Cave to send a Rx for his plain adderall to [facility]. Rx was sent but the qty was only for 30, sig says take 2 daily. Pt needs another Rx sent.   Please advise. Thanks.

## 2018-10-08 MED ORDER — AMPHETAMINE-DEXTROAMPHETAMINE 20 MG PO TABS: 40.0000 mg | ORAL_TABLET | Freq: Every day | ORAL | 0 refills | Status: DC

## 2018-10-08 NOTE — Telephone Encounter (Signed)
SW pt and advised him that we did re-send Rx. He stated that [facility] but that it has a fill on/after 10/18/18 date.  I have removed this date, please re-send. Thanks.

## 2018-10-08 NOTE — Telephone Encounter (Signed)
Pt is calling and he needs #30 generic adderall 20 mg to be [facility] [place]. Pt pick up #30 on 09/18/18. Pt takes med twice a day

## 2018-10-08 NOTE — Addendum Note (Signed)
Addended by: Smitty Knudsen on: 10/08/2018 11:24 AM   Modules accepted: Orders

## 2018-10-09 NOTE — Telephone Encounter (Signed)
Tried calling pt, NA and unable to leave a message due to vm box being full or not set up yet.  

## 2018-10-12 NOTE — Telephone Encounter (Signed)
Tried calling pt, NA and unable to leave a message due to vm box being full or not set up yet.  

## 2018-12-16 ENCOUNTER — Other Ambulatory Visit: Payer: Self-pay | Admitting: Family Medicine

## 2018-12-17 NOTE — Telephone Encounter (Signed)
RF request for adderall  Last OV 09/18/18 No upcoming appt Last RX printed 09/18/2018 with fill on or after 11/17/2018  Please advise.

## 2018-12-30 ENCOUNTER — Other Ambulatory Visit: Payer: Self-pay | Admitting: Family Medicine

## 2018-12-31 ENCOUNTER — Other Ambulatory Visit: Payer: Self-pay

## 2018-12-31 ENCOUNTER — Ambulatory Visit (INDEPENDENT_AMBULATORY_CARE_PROVIDER_SITE_OTHER): Payer: PRIVATE HEALTH INSURANCE | Admitting: Family Medicine

## 2018-12-31 ENCOUNTER — Encounter: Payer: Self-pay | Admitting: Family Medicine

## 2018-12-31 DIAGNOSIS — F909 Attention-deficit hyperactivity disorder, unspecified type: Secondary | ICD-10-CM | POA: Diagnosis not present

## 2018-12-31 DIAGNOSIS — F3340 Major depressive disorder, recurrent, in remission, unspecified: Secondary | ICD-10-CM

## 2018-12-31 MED ORDER — DEXTROAMPHETAMINE SULFATE ER 15 MG PO CP24: 15.0000 mg | ORAL_CAPSULE | Freq: Every day | ORAL | 0 refills | Status: DC

## 2018-12-31 MED ORDER — DEXTROAMPHETAMINE SULFATE 10 MG PO TABS: ORAL_TABLET | ORAL | 0 refills | Status: DC

## 2018-12-31 NOTE — Progress Notes (Signed)
Virtual Visit via Video Note  I connected with@ on 12/31/18 at  3:30 PM EDT by a video enabled telemedicine application and verified that I am speaking with the correct person using two identifiers.  Location patient: home Location provider:work or home office Persons participating in the virtual visit: patient, provider  I discussed the limitations of evaluation and management by telemedicine and the availability of in person appointments. The patient expressed understanding and agreed to proceed.   HPI:  28 y/o WM who I'm seeing for 3 mo adult ADHD f/u. Pt states all is going well with the med at current dosing: much improved focus, concentration, task completion.  Less frustration, better multitasking, less impulsivity and restlessness.  Mood is stable. No side effects from the medication.  He also takes paroxetine for hx of depression and states mood has been stable, no depression.  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  No side effects from meds.  He got a new insurance carrier ([employer] [place]) and they are stating he can only have 1 visit per year, has been given some confusing info so he asks if I'll call the provider services and inquire about this. He is also having some probs with them covering adderall, so he asks to change back to dexedrine that he has been on before he had to switch to adderall. He tolerated dexedrine well--had to get off of it for insurance reasons.    ROS: See pertinent positives and negatives per HPI.  Past Medical History:  Diagnosis Date  . ADHD (attention deficit hyperactivity disorder)   . Anxiety and depression   . 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 Mack Hookavid Thompson with [facility] plans to do surgery as of 10/01/13.  . 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 Hematochezia    Dr. Russella DarStark recommended colonoscopy 12/2017  . History of  shingles   . Tinea versicolor    Responded completely to ketoconazole oral (2018)  . Tobacco dependence   . Wrist fracture, closed 05/2016   left    Past Surgical History:  Procedure Laterality Date  . NO PAST SURGERIES      Family History  Problem Relation Age of Onset  . Heart disease Maternal Grandfather   . Mental illness Maternal Grandfather   . Diabetes Maternal Grandfather   . Colon cancer Neg Hx   . Colon polyps Neg Hx   . Kidney disease Neg Hx   . Esophageal cancer Neg Hx   . Gallbladder disease Neg Hx        Current Outpatient Medications:  .  amphetamine-dextroamphetamine (ADDERALL XR) 20 MG 24 hr capsule, TAKE 2 CAPSULES ONCE DAILY IN THE MORNING., Disp: 60 capsule, Rfl: 0 .  amphetamine-dextroamphetamine (ADDERALL) 20 MG tablet, Take 2 tablets (40 mg total) by mouth daily., Disp: 60 tablet, Rfl: 0 .  PARoxetine (PAXIL) 20 MG tablet, Take 1 tablet (20 mg total) by mouth daily., Disp: 90 tablet, Rfl: 3  EXAM:  VITALS per patient if applicable: There were no vitals taken for this visit.   GENERAL: alert, oriented, appears well and in no acute distress  HEENT: atraumatic, conjunttiva clear, no obvious abnormalities on inspection of external nose and ears  NECK: normal movements of the head and neck  LUNGS: on inspection no signs of respiratory distress, breathing rate appears normal, no obvious gross SOB, gasping or wheezing  CV: no obvious cyanosis  MS: moves all visible extremities without noticeable abnormality  PSYCH/NEURO: pleasant and cooperative, no obvious depression or anxiety, speech and thought processing grossly intact  ASSESSMENT AND PLAN:  Discussed the following assessment and plan:  1) Adult ADHD and hx of depression (in remission). The current medical regimen is effective but due to insurance company change we'll try getting him back on dexedrine, and I'll have to contact them and make sure they'll cover at least a q6 mo f/u for his  ADHD recheck. Dexedrine 15 mg 24h, 2 tabs po qAM, #60 and dexedrin (dextrostat) 10mg , 2 tabs po q afternoon, #60 were eRx'd for this month, May 2020, and June 2020.  Appropriate fill on/after dates on rx's. CSC needs updating at next in-office f/u visit.  We'll also get a UDS at that time.  2) Depression, in remission. He does MUCH better when he stays on this med long term. Has had depression recurrence when he has weaned off of it in the past.    I discussed the assessment and treatment plan with the patient. The patient was provided an opportunity to ask questions and all were answered. The patient agreed with the plan and demonstrated an understanding of the instructions.   The patient was advised to call back or seek an in-person evaluation if the symptoms worsen or if the condition fails to improve as anticipated.  F/u: 3-6 mo, depending on what his insurer ends up saying.  Signed:  Santiago Bumpers, MD           12/31/2018

## 2018-12-31 NOTE — Telephone Encounter (Signed)
Called and spoke with patient about medication, was due in March for OV.  Adderall XR is not ready for refill, knows that, but the adderall 20mg  is what needs filled. Pt was offered virtual visit and educated on needing to come for appt as it is a scheduled medication. Pt has new insurance and only thinks they cover wellness visits so does not know if he can come in. Transferred to [place] to review insurance and pt was told to call insurance company to ask about benefits and appt coverage. Pt was educated that he cannot just come once yearly for a Wellness exam given his medications. He verbalized understanding.   RF request for Adderall 20mg   LOV:09/18/2018 Next ov: Not scheduled  Last written: 10/08/2018 #60 0 refills   Please advise on the regular adderall

## 2019-01-25 ENCOUNTER — Telehealth: Payer: Self-pay | Admitting: Family Medicine

## 2019-01-25 ENCOUNTER — Telehealth: Payer: Self-pay

## 2019-01-25 NOTE — Telephone Encounter (Signed)
Copied from CRM [id number]. Topic: General - Other >> Jan 22, 2019  4:37 PM Laural Benes, [place] C wrote: Reason for CRM: pt called in to request a call back. Pt says that it is time for a refill on his medication dextroamphetamine (DEXTROSTAT) 10 MG tablet. Pt would like to discuss making a change to his medication before receiving a refill.  Please assist. [phone]  Pt was called and unable to leave message, no VM set up

## 2019-01-25 NOTE — Telephone Encounter (Signed)
Lisa sent me a rx request for this pt but I accidentally closed the encounter. Pt actually called an was requesting new rx for higher dose of his dexedrine. Pls notify pt that he is already on the highest dose of this med that I am comfortable and willing to prescribe.  He needs to fill the rx's for May I sent in last month.--thx

## 2019-01-25 NOTE — Telephone Encounter (Signed)
Patient requests his dose be increased, he states dose was decreased at last visit.  I advised patient he may need to do virtual visit to adjust meds.  Please advise.

## 2019-01-25 NOTE — Telephone Encounter (Signed)
Pt returned call. Please call [phone] Thanks

## 2019-01-26 ENCOUNTER — Telehealth: Payer: Self-pay

## 2019-01-26 NOTE — Telephone Encounter (Signed)
Pt was advised and verbalized understanding.

## 2019-01-26 NOTE — Telephone Encounter (Signed)
Separate telephone message, PCP advised he does not feel comfortable increasing his dose and would need to get refill already on file for this moth. Pt was advised

## 2019-01-26 NOTE — Telephone Encounter (Signed)
Erroneous encounter

## 2019-03-29 ENCOUNTER — Other Ambulatory Visit: Payer: Self-pay

## 2019-03-29 ENCOUNTER — Other Ambulatory Visit: Payer: Self-pay | Admitting: Family Medicine

## 2019-03-29 ENCOUNTER — Ambulatory Visit (INDEPENDENT_AMBULATORY_CARE_PROVIDER_SITE_OTHER): Payer: PRIVATE HEALTH INSURANCE | Admitting: Family Medicine

## 2019-03-29 ENCOUNTER — Encounter: Payer: Self-pay | Admitting: Family Medicine

## 2019-03-29 DIAGNOSIS — F909 Attention-deficit hyperactivity disorder, unspecified type: Secondary | ICD-10-CM | POA: Diagnosis not present

## 2019-03-29 DIAGNOSIS — B882 Other arthropod infestations: Secondary | ICD-10-CM | POA: Diagnosis not present

## 2019-03-29 MED ORDER — DEXTROAMPHETAMINE SULFATE 10 MG PO TABS: ORAL_TABLET | ORAL | 0 refills | Status: DC

## 2019-03-29 MED ORDER — DOXYCYCLINE HYCLATE 100 MG PO CAPS: 100.0000 mg | ORAL_CAPSULE | Freq: Two times a day (BID) | ORAL | 0 refills | Status: AC

## 2019-03-29 MED ORDER — DEXTROAMPHETAMINE SULFATE ER 10 MG PO CP24: ORAL_CAPSULE | ORAL | 0 refills | Status: DC

## 2019-03-29 NOTE — Progress Notes (Signed)
Virtual Visit via Video Note  I connected with pt on 03/29/19 at  9:00 AM EDT by a video enabled telemedicine application and verified that I am speaking with the correct person using two identifiers.  Location patient: home Location provider:work or home office Persons participating in the virtual visit: patient, provider  I discussed the limitations of evaluation and management by telemedicine and the availability of in person appointments. The patient expressed understanding and agreed to proceed.  Telemedicine visit is a necessity given the COVID-19 restrictions in place at the current time.  HPI: 28 y/o WM being seen today for 3 mo f/u adult ADHD. At the time of last visit he was having some trouble with his insurance coverage regarding how many visits he could have per year, also they would not cover adderall so we switched him back to dexedrine.   A/P as of last o/v: "Dexedrine 15 mg 24h, 2 tabs po qAM, #60 and dexedrin (dextrostat) 10mg , 2 tabs po q afternoon, #60 were eRx'd for this month, May 2020, and June 2020.  Appropriate fill on/after dates on rx's.  Pt states all is going well with the med at current dosing: much improved focus, concentration, task completion.  Less frustration, better multitasking, less impulsivity and restlessness.  Mood is stable. No side effects from the medication. He does feel like a little increase in the extended release dexedrine --to 20mg  qAM dosing--would be ideal.  He says the afternoon quick release/short acting dexedrine is helping excellently.  Other problem: noted a tick bite about 1 mo ago.  No rash at the tick bite area. Gradually he noted he started feeling fatigued, achy, HA, feverish, and had speckly rash around wrists and ankles.  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 He had 3 of the 100mg  doxycycline at home from prior rx and took 3 tabs total.  He started feeling better very quickly.  He wonders if he should  take more doxy or not.  No further fevers, HA, body aches, fatigue, or rash.  ROS: no CP, no SOB, no wheezing, no cough, no dizziness, no HAs,  no melena/hematochezia.  No polyuria or polydipsia.     Past Medical History:  Diagnosis Date  . ADHD (attention deficit hyperactivity disorder)   . Anxiety and depression   . 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 Mack Hookavid Thompson with [facility] plans to do surgery as of 10/01/13.  . 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 Hematochezia    Dr. Russella DarStark recommended colonoscopy 12/2017  . History of shingles   . Tinea versicolor    Responded completely to ketoconazole oral (2018)  . Tobacco dependence   . Wrist fracture, closed 05/2016   left    Past Surgical History:  Procedure Laterality Date  . NO PAST SURGERIES      Family History  Problem Relation Age of Onset  . Heart disease Maternal Grandfather   . Mental illness Maternal Grandfather   . Diabetes Maternal Grandfather   . Colon cancer Neg Hx   . Colon polyps Neg Hx   . Kidney disease Neg Hx   . Esophageal cancer Neg Hx   . Gallbladder disease Neg Hx      Current Outpatient Medications:  .  dextroamphetamine (DEXTROSTAT) 10 MG tablet, 2 tabs po q afternoon, Disp: 60 tablet, Rfl: 0 .  PARoxetine (PAXIL) 20 MG tablet, Take 1 tablet (  20 mg total) by mouth daily., Disp: 90 tablet, Rfl: 3 .  dextroamphetamine (DEXEDRINE SPANSULE) 10 MG 24 hr capsule, 2 caps po qAM, Disp: 60 capsule, Rfl: 0 .  doxycycline (VIBRAMYCIN) 100 MG capsule, Take 1 capsule (100 mg total) by mouth 2 (two) times daily for 7 days., Disp: 14 capsule, Rfl: 0  EXAM:  VITALS per patient if applicable: There were no vitals taken for this visit.   GENERAL: alert, oriented, appears well and in no acute distress  HEENT: atraumatic, conjunttiva clear, no obvious abnormalities on inspection of external nose and ears  NECK: normal movements of the head and  neck  LUNGS: on inspection no signs of respiratory distress, breathing rate appears normal, no obvious gross SOB, gasping or wheezing  CV: no obvious cyanosis  MS: moves all visible extremities without noticeable abnormality  PSYCH/NEURO: pleasant and cooperative, no obvious depression or anxiety, speech and thought processing grossly intact  ASSESSMENT AND PLAN:  Discussed the following assessment and plan:  1) Adult ADHD: doing well. I'll increase his morning ER dexedrine to TWO of thte 10mg  caps every morning and will continue the short acting/IR dexedrine in afternoon at same dose (2 of the 10mg  tabs q afternoon). He is ok for 6 mo routine f/u.   I did electronic rx's for dexedrine spansules 10mg , 2 caps qAM, #60 and dextrostat 10mg , 2 tabs q afternon, #60  today for this month, Aug 2020, and Sept 2020.  Appropriate fill on/after date was noted on each rx.  2) Tick born illness: improved with a few doses of doxy, but will give him doxy 100mg  bid x 7 more days for full treatment.    I discussed the assessment and treatment plan with the patient. The patient was provided an opportunity to ask questions and all were answered. The patient agreed with the plan and demonstrated an understanding of the instructions.   The patient was advised to call back or seek an in-person evaluation if the symptoms worsen or if the condition fails to improve as anticipated.  F/u: 6 mo  Signed:  Crissie Sickles, MD           03/29/2019

## 2019-06-26 ENCOUNTER — Other Ambulatory Visit: Payer: Self-pay | Admitting: Family Medicine

## 2019-06-28 ENCOUNTER — Other Ambulatory Visit: Payer: Self-pay | Admitting: Family Medicine

## 2019-06-28 MED ORDER — DEXTROAMPHETAMINE SULFATE 10 MG PO TABS: ORAL_TABLET | ORAL | 0 refills | Status: DC

## 2019-06-28 MED ORDER — DEXTROAMPHETAMINE SULFATE ER 10 MG PO CP24: ORAL_CAPSULE | ORAL | 0 refills | Status: DC

## 2019-06-28 NOTE — Telephone Encounter (Signed)
2nd request  [phone]

## 2019-06-28 NOTE — Telephone Encounter (Signed)
RF request for dextroamphetamine 10mg  capsules and 10mg  Tablets Last OV 03/29/2019 No upcoming but not due until January 2021 Last RF dated 05/28/2019 # 60 on both without RF.  Please advise.

## 2019-07-29 ENCOUNTER — Telehealth: Payer: Self-pay

## 2019-07-29 MED ORDER — DEXTROAMPHETAMINE SULFATE 10 MG PO TABS: ORAL_TABLET | ORAL | 0 refills | Status: DC

## 2019-07-29 MED ORDER — DEXTROAMPHETAMINE SULFATE ER 10 MG PO CP24: ORAL_CAPSULE | ORAL | 0 refills | Status: DC

## 2019-07-29 NOTE — Telephone Encounter (Signed)
Pt left VM on nurses line stating he needed medication changed to another pharmacy and would like a call back to discuss

## 2019-07-29 NOTE — Telephone Encounter (Signed)
OK, rx's for this month and Dec sent. Needs f/u 2 mo.-thx

## 2019-07-29 NOTE — Addendum Note (Signed)
Addended by: Tammi Sou on: 07/29/2019 05:06 PM   Modules accepted: Orders

## 2019-07-29 NOTE — Addendum Note (Signed)
Addended by: Caroll Rancher L on: 07/29/2019 04:08 PM   Modules accepted: Orders

## 2019-07-29 NOTE — Telephone Encounter (Signed)
Pt has lost insurance and needs RX sent to [facility] [address] because they use [facility] and previous pharmacy does not.   RF request for Dexedrine 10mg   LOV: 03/29/2019 Next ov: Not scheduled  Last written: 06/28/2019  I cancelled [facility] at [facility]. [facility]

## 2019-07-30 NOTE — Telephone Encounter (Signed)
Pt was called and given information.  

## 2019-08-13 ENCOUNTER — Other Ambulatory Visit: Payer: Self-pay | Admitting: Internal Medicine

## 2019-08-13 ENCOUNTER — Ambulatory Visit: Payer: Self-pay

## 2019-08-13 ENCOUNTER — Other Ambulatory Visit: Payer: Self-pay

## 2019-08-13 DIAGNOSIS — M79644 Pain in right finger(s): Secondary | ICD-10-CM

## 2019-08-27 ENCOUNTER — Other Ambulatory Visit: Payer: Self-pay | Admitting: Family Medicine

## 2019-08-27 MED ORDER — DEXTROAMPHETAMINE SULFATE ER 10 MG PO CP24: ORAL_CAPSULE | ORAL | 0 refills | Status: DC

## 2019-08-27 MED ORDER — DEXTROAMPHETAMINE SULFATE 10 MG PO TABS: ORAL_TABLET | ORAL | 0 refills | Status: DC

## 2019-08-27 NOTE — Telephone Encounter (Signed)
Patient refill request  dextroamphetamine (DEXEDRINE SPANSULE) 10 MG 24 hr capsule [[id number]]   dextroamphetamine (DEXTROSTAT) 10 MG tablet [[id number]  [id number] [place], [address]

## 2019-08-27 NOTE — Telephone Encounter (Signed)
Spoke with pt's pharmacy, no Rx on fill for Dec for either dextroamphetamine 10mg  capsule or tablet.   RF request for Dextro 10mg  cap, tab LOV: 03/29/19 Next ov: advised to f/u Jan Last written: PCP noted rx's sent for Nov and Dec   Please advise, thanks. Medications pending. Pt is currently without insurance but should have something by beginning of the year.

## 2019-08-27 NOTE — Telephone Encounter (Signed)
PCP sent rx's for Nov and Dec noted on 07/29/19. He will need to schedule next f/u for Jan for further refills.

## 2019-09-10 ENCOUNTER — Other Ambulatory Visit: Payer: Self-pay | Admitting: Family Medicine

## 2019-09-22 ENCOUNTER — Telehealth: Payer: Self-pay

## 2019-09-22 NOTE — Telephone Encounter (Signed)
Unable to leave message on patient's phone to reschedule 09/27/19 visit for medication refills.

## 2019-09-27 ENCOUNTER — Telehealth: Payer: Self-pay

## 2019-09-27 ENCOUNTER — Ambulatory Visit: Payer: Self-pay | Admitting: Family Medicine

## 2019-09-27 MED ORDER — DEXTROAMPHETAMINE SULFATE 10 MG PO TABS: ORAL_TABLET | ORAL | 0 refills | Status: DC

## 2019-09-27 MED ORDER — DEXTROAMPHETAMINE SULFATE ER 10 MG PO CP24: ORAL_CAPSULE | ORAL | 0 refills | Status: DC

## 2019-09-27 NOTE — Telephone Encounter (Signed)
RF request for dexedrine ( pt requesting 2 day supply till appt 09/29/19 )  Last OV 03/29/2019 Last RX states fill on or after 08/27/2019 Next OV 09/29/19   Please advise.

## 2019-09-27 NOTE — Telephone Encounter (Signed)
I will eRx 1 mo supply to [facility] and he should keep upcoming appt.

## 2019-09-27 NOTE — Telephone Encounter (Signed)
Unable to reach patient to advise physician out of office 09/27/19. Patient came in and rescheduled appointment to first available 09/29/19. Can a 2 day supply of dextroamphetamine (DEXEDRINE SPANSULE) 10 MG 24 hr capsule be sent to [facility]?

## 2019-09-28 NOTE — Telephone Encounter (Signed)
Tried contacting patient, unable to reach or leave message. Pt's appt is tomorrow, 01/06

## 2019-09-29 ENCOUNTER — Other Ambulatory Visit: Payer: Self-pay

## 2019-09-29 ENCOUNTER — Ambulatory Visit: Payer: 59 | Admitting: Family Medicine

## 2019-09-29 ENCOUNTER — Encounter: Payer: Self-pay | Admitting: Family Medicine

## 2019-09-29 ENCOUNTER — Telehealth: Payer: Self-pay | Admitting: Family Medicine

## 2019-09-29 VITALS — BP 123/71 | HR 97 | Temp 98.4°F | Resp 16 | Ht 70.0 in | Wt 166.6 lb

## 2019-09-29 DIAGNOSIS — F909 Attention-deficit hyperactivity disorder, unspecified type: Secondary | ICD-10-CM

## 2019-09-29 DIAGNOSIS — F3342 Major depressive disorder, recurrent, in full remission: Secondary | ICD-10-CM | POA: Diagnosis not present

## 2019-09-29 MED ORDER — DEXTROAMPHETAMINE SULFATE 10 MG PO TABS: ORAL_TABLET | ORAL | 0 refills | Status: DC

## 2019-09-29 MED ORDER — DEXTROAMPHETAMINE SULFATE ER 10 MG PO CP24: ORAL_CAPSULE | ORAL | 0 refills | Status: DC

## 2019-09-29 MED ORDER — PAROXETINE HCL 20 MG PO TABS: 20.0000 mg | ORAL_TABLET | Freq: Every day | ORAL | 3 refills | Status: DC

## 2019-09-29 NOTE — Telephone Encounter (Signed)
New Rx sent today (60,0) for ER dexedrine 10mg  capsules, take 2 qAM is no longer covered. Pharmacy updated for [facility] [address] [facility] [place].  Please advise if new Rx can be sent, thanks. Will notify patient

## 2019-09-29 NOTE — Telephone Encounter (Signed)
Patient was seen by Dr. Milinda Cave today.  Pharmacy told patient they could not fill this prescription any longer because they said "it costs [place] too much to fill it".  Please note*and document for future fills * Send extended release tablet from this day forward:  [facility] [address]    All other prescriptions will go to [facility] including the non extended release of dexedrine

## 2019-09-29 NOTE — Telephone Encounter (Signed)
OK, done

## 2019-09-29 NOTE — Progress Notes (Signed)
OFFICE VISIT  09/29/2019   CC:  Chief Complaint  Patient presents with  . Follow-up    ADHD     HPI:    Patient is a 29 y.o. Caucasian male who presents for 6 mo f/u ADHD and hx of recurrent MDD/anxiety. A/P as of last visit: "1) Adult ADHD: doing well. I'll increase his morning ER dexedrine to TWO of thte 10mg  caps every morning and will continue the short acting/IR dexedrine in afternoon at same dose (2 of the 10mg  tabs q afternoon). He is ok for 6 mo routine f/u.   I did electronic rx's for dexedrine spansules 10mg , 2 caps qAM, #60 and dextrostat 10mg , 2 tabs q afternon, #60  today for this month, Aug 2020, and Sept 2020.  Appropriate fill on/after date was noted on each rx.  2) Tick born illness: improved with a few doses of doxy, but will give him doxy 100mg  bid x 7 more days for full treatment. "  Interim hx: Doing well, feeling fine. Working in at , evening shift and has lost some wt with all the activity.  Pt states all is going well with the med at current dosing: much improved focus, concentration, task completion.  Less frustration, better multitasking, less impulsivity and restlessness.  Mood is stable. No side effects from the medication.   PMP AWARE reviewed today: most recent rx for dexedrine regular was filled 09/01/19, # 60, rx by me. Most recent rx fill for dexedrine ER was 08/27/19, #60 rx by me. No red flags.   Past Medical History:  Diagnosis Date  . ADHD (attention deficit hyperactivity disorder)   . Anxiety and depression   . 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 Teaching laboratory technician with [facility] plans to do surgery as of 10/01/13.  . 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 Hematochezia    Dr. 14/4/20 recommended colonoscopy 12/2017  . History of shingles   . Tinea versicolor    Responded completely to ketoconazole oral (2018)  . Tobacco dependence   . Wrist fracture, closed 05/2016   left     Past Surgical History:  Procedure Laterality Date  . NO PAST SURGERIES      Outpatient Medications Prior to Visit  Medication Sig Dispense Refill  . dextroamphetamine (DEXEDRINE SPANSULE) 10 MG 24 hr capsule TAKE 2 CAPSULES DAILY IN THE AM. 60 capsule 0  . dextroamphetamine (DEXTROSTAT) 10 MG tablet TAKE 2 TABLETS EACH AFTERNOON. 60 tablet 0  . PARoxetine (PAXIL) 20 MG tablet TAKE 1 TABLET BY MOUTH EVERY DAY 30 tablet 1   No facility-administered medications prior to visit.    Allergies  Allergen Reactions  . Nasonex [Mometasone] Other (See Comments)    Nasal irritation/nosebleeds    ROS As per HPI  PE: Blood pressure 123/71, pulse 97, temperature 98.4 F (36.9 C), temperature source Temporal, resp. rate 16, height 5\' 10"  (1.778 m), weight 166 lb 9.6 oz (75.6 kg), SpO2 100 %. Wt Readings from Last 2 Encounters:  09/29/19 166 lb 9.6 oz (75.6 kg)  09/18/18 176 lb 6 oz (80 kg)    Gen: alert, oriented x 4, affect pleasant.  Lucid thinking and conversation noted. HEENT: PERRLA, EOMI.   Neck: no LAD, mass, or thyromegaly. CV: RRR, no m/r/g LUNGS: CTA bilat, nonlabored. NEURO: no tremor or tics noted on observation.  Coordination intact. CN 2-12 grossly intact bilaterally, strength 5/5 in all extremeties.  No ataxia.  LABS:    Chemistry      Component Value Date/Time   NA 138 11/12/2017 1026   K 4.3 11/12/2017 1026   CL 101 11/12/2017 1026   CO2 31 11/12/2017 1026   BUN 11 11/12/2017 1026   CREATININE 0.93 11/12/2017 1026      Component Value Date/Time   CALCIUM 9.8 11/12/2017 1026   ALKPHOS 56 11/12/2017 1026   AST 14 11/12/2017 1026   ALT 14 11/12/2017 1026   BILITOT 0.7 11/12/2017 1026     Lab Results  Component Value Date   WBC 5.4 11/12/2017   HGB 14.8 11/12/2017   HCT 43.9 11/12/2017   MCV 86.3 11/12/2017   PLT 291.0 11/12/2017   Lab Results  Component Value Date   TSH 3.363 05/18/2016    IMPRESSION AND PLAN:  1) ADHD, combined  type. The current medical regimen is effective;  continue present plan and medications. I did electronic rx's for dexedrine 10mg , 2 tabs qAM, #60 and dextrostat 10mg , 2 tabs q afternoon, #60 today for this month, Feb 2021, and Mar 2021.  Appropriate fill on/after date was noted on each rx.  2) Recurrent MDD: in long term remission. Wants to stay on his paroxetine indefinitely and I agree with this. RF'd paxil 20mg  qd today.  3) Preventative health care: He declines flu vaccine.  An After Visit Summary was printed and given to the patient.  FOLLOW UP: Return in about 6 months (around 03/28/2020) for routine chronic illness f/u.  Signed:  Crissie Sickles, MD           09/29/2019

## 2019-09-30 NOTE — Telephone Encounter (Signed)
Pt notified by pharmacy.

## 2019-10-19 ENCOUNTER — Ambulatory Visit: Payer: 59 | Attending: Internal Medicine

## 2019-10-19 DIAGNOSIS — Z20822 Contact with and (suspected) exposure to covid-19: Secondary | ICD-10-CM

## 2019-10-20 LAB — NOVEL CORONAVIRUS, NAA: SARS-CoV-2, NAA: NOT DETECTED

## 2019-10-27 ENCOUNTER — Telehealth: Payer: Self-pay | Admitting: General Practice

## 2019-10-27 NOTE — Telephone Encounter (Signed)
Negative COVID results given. Patient results "NOT Detected." Caller expressed understanding. ° °

## 2019-12-22 ENCOUNTER — Ambulatory Visit: Payer: 59 | Admitting: Family Medicine

## 2019-12-22 ENCOUNTER — Other Ambulatory Visit: Payer: Self-pay

## 2019-12-22 ENCOUNTER — Encounter: Payer: Self-pay | Admitting: Family Medicine

## 2019-12-22 VITALS — BP 134/69 | HR 81 | Temp 98.0°F | Resp 16 | Ht 70.0 in | Wt 156.8 lb

## 2019-12-22 DIAGNOSIS — F3342 Major depressive disorder, recurrent, in full remission: Secondary | ICD-10-CM

## 2019-12-22 DIAGNOSIS — F902 Attention-deficit hyperactivity disorder, combined type: Secondary | ICD-10-CM

## 2019-12-22 DIAGNOSIS — M25561 Pain in right knee: Secondary | ICD-10-CM

## 2019-12-22 MED ORDER — DEXTROAMPHETAMINE SULFATE ER 15 MG PO CP24: ORAL_CAPSULE | ORAL | 0 refills | Status: DC

## 2019-12-22 MED ORDER — DEXTROAMPHETAMINE SULFATE 10 MG PO TABS: ORAL_TABLET | ORAL | 0 refills | Status: DC

## 2019-12-22 NOTE — Progress Notes (Signed)
OFFICE VISIT  12/22/2019   CC:  Chief Complaint  Patient presents with  . Follow-up    ADHD and MDD   HPI:    Patient is a 29 y.o. Caucasian male who presents for 3 mo f/u ADHD and recurrent MDD that has been in remission with ongoing use of paxil.  A/P as of last visit: "1) ADHD, combined type. The current medical regimen is effective;  continue present plan and medications. I did electronic rx's for dexedrine 10mg , 2 tabs qAM, #60 and dextrostat 10mg , 2 tabs q afternoon, #60 today for this month, Feb 2021, and Mar 2021.  Appropriate fill on/after date was noted on each rx.  2) Recurrent MDD: in long term remission. Wants to stay on his paroxetine indefinitely and I agree with this. RF'd paxil 20mg  qd today."   Interim hx: Working 12 hour days with [employer].  Very happy with this, getting benefits soon. Hanging out with friends on days off. Eagerly awaiting [place] season.  Says dexedrine doesn't seem to be as helpful, particularly towards end of day. Concentration and focus and task completion are impaired some later in his shift at work. No side effect from meds at this time. Mood is fine, says he feels better than he has in a long time.  Reports R knee pain after twisting it at work 5 nights ago and it then hit a plastic box. Minimal swelling, some pain-->but not until the next day.  Has been elevating it some.  No ice or NSAIDs. He feels the most pain in back of knee at proximal calf region.  Feeling better and swelling all gone today.  PMP AWARE reviewed today: most recent rx for dextro ER was filled 11/30/19, # 63, rx by me. Dextro plain 10mg  rx last filled 11/26/19. No red flags.   Past Medical History:  Diagnosis Date  . ADHD (attention deficit hyperactivity disorder)   . Anxiety and depression   . 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 Milly Jakob with [facility] plans to do surgery as of 10/01/13.  . 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 Hematochezia    Dr. Fuller Plan recommended colonoscopy 12/2017  . History of shingles   . Tinea versicolor    Responded completely to ketoconazole oral (2018)  . Tobacco dependence   . Wrist fracture, closed 05/2016   left    Past Surgical History:  Procedure Laterality Date  . NO PAST SURGERIES      Outpatient Medications Prior to Visit  Medication Sig Dispense Refill  . PARoxetine (PAXIL) 20 MG tablet Take 1 tablet (20 mg total) by mouth daily. 90 tablet 3  . dextroamphetamine (DEXEDRINE SPANSULE) 10 MG 24 hr capsule TAKE 2 CAPSULES DAILY IN THE AM. 60 capsule 0  . dextroamphetamine (DEXTROSTAT) 10 MG tablet TAKE 2 TABLETS EACH AFTERNOON. 60 tablet 0   No facility-administered medications prior to visit.    Allergies  Allergen Reactions  . Nasonex [Mometasone] Other (See Comments)    Nasal irritation/nosebleeds    ROS As per HPI  PE: Blood pressure 134/69, pulse 81, temperature 98 F (36.7 C), temperature source Temporal, resp. rate 16, height 5\' 10"  (1.778 m), weight 156 lb 12.8 oz (71.1 kg), SpO2 100 %. Wt Readings from Last 2 Encounters:  12/22/19 156 lb 12.8 oz (71.1 kg)  09/29/19 166 lb 9.6 oz (75.6 kg)    Gen: alert, oriented x 4, affect pleasant.  Lucid thinking  and conversation noted. HEENT: PERRLA, EOMI.   Neck: no LAD, mass, or thyromegaly. CV: RRR, no m/r/g LUNGS: CTA bilat, nonlabored. NEURO: no tremor or tics noted on observation.  Coordination intact. CN 2-12 grossly intact bilaterally, strength 5/5 in all extremeties.  No ataxia. R knee without swelling, warmth, tenderness, or erythema.  ROM fully intact w/out instability. Mild TTP only in proximal-most aspect of calf.   No edema.   LABS:  Lab Results  Component Value Date   TSH 3.363 05/18/2016   Lab Results  Component Value Date   WBC 5.4 11/12/2017   HGB 14.8 11/12/2017   HCT 43.9 11/12/2017   MCV 86.3 11/12/2017   PLT 291.0 11/12/2017   Lab Results  Component  Value Date   CREATININE 0.93 11/12/2017   BUN 11 11/12/2017   NA 138 11/12/2017   K 4.3 11/12/2017   CL 101 11/12/2017   CO2 31 11/12/2017   Lab Results  Component Value Date   ALT 14 11/12/2017   AST 14 11/12/2017   ALKPHOS 56 11/12/2017   BILITOT 0.7 11/12/2017      IMPRESSION AND PLAN:  1) ADHD, combined type.  Too much waning efficacy at end of day. Increase extended release dexedrine to 15 mg strength and he may divide this dose bid if needed. Also continue "plain" dexedrine TWO of the 10mg  tabs per day.  2) MDD, in remission long term. Plan is continue paxil indefinitely.  3) R knee strain, muscular.  No sign of internal derangement or contusion. Improving with rest and elevation, esp the last 1-2 days. Reassured.  An After Visit Summary was printed and given to the patient.  FOLLOW UP: Return in about 3 months (around 03/22/2020) for routine chronic illness f/u.  Signed:  03/24/2020, MD           12/22/2019

## 2020-03-13 ENCOUNTER — Telehealth: Payer: Self-pay

## 2020-03-13 NOTE — Telephone Encounter (Signed)
Patient called in requesting a bridge of medication until 03/24/20 appt     dextroamphetamine (DEXEDRINE SPANSULE) 15 MG 24 hr capsule    dextroamphetamine (DEXTROSTAT) 10 MG tablet   [id number] [place], [place] - [address]

## 2020-03-14 MED ORDER — DEXTROAMPHETAMINE SULFATE 10 MG PO TABS: ORAL_TABLET | ORAL | 0 refills | Status: DC

## 2020-03-14 MED ORDER — DEXTROAMPHETAMINE SULFATE ER 15 MG PO CP24: ORAL_CAPSULE | ORAL | 0 refills | Status: DC

## 2020-03-14 NOTE — Telephone Encounter (Signed)
OK, dexedrine eRx'd.

## 2020-03-14 NOTE — Telephone Encounter (Signed)
RF request for Dextroamphetamine 15mg  24h capsule and 10mg  tablet to last him until upcoming appt LOV: 12/22/19 Next ov: 03/24/2020 Last written: Feb 2021 and 11/2019, 30 day supply for both   PMP AWARE reviewed 12/22/2019: most recent rx for dextro ER was filled 11/30/19, # 60, rx by me. Dextro plain 10mg  rx last filled 11/26/19. No red flags.

## 2020-03-14 NOTE — Addendum Note (Signed)
Addended by: Jeoffrey Massed on: 03/14/2020 01:29 PM   Modules accepted: Orders

## 2020-03-24 ENCOUNTER — Ambulatory Visit: Payer: 59 | Admitting: Family Medicine

## 2020-04-10 ENCOUNTER — Ambulatory Visit: Payer: 59 | Admitting: Family Medicine

## 2020-04-10 DIAGNOSIS — Z0289 Encounter for other administrative examinations: Secondary | ICD-10-CM

## 2020-04-10 NOTE — Progress Notes (Deleted)
OFFICE VISIT  04/10/2020   CC: No chief complaint on file.    HPI:    Patient is a 29 y.o. Caucasian male who presents for 3 mo f/u adult ADD. A/P as of last visit: "1) ADHD, combined type.  Too much waning efficacy at end of day. Increase extended release dexedrine to 15 mg strength and he may divide this dose bid if needed. Also continue "plain" dexedrine TWO of the 10mg  tabs per day.  2) MDD, in remission long term. Plan is continue paxil indefinitely.  3) R knee strain, muscular.  No sign of internal derangement or contusion. Improving with rest and elevation, esp the last 1-2 days. Reassured."  INTERIM HX: ***  PMP AWARE reviewed today: most recent rx for *** was filled ***, # ***, rx by ***. No red flags.   Past Medical History:  Diagnosis Date  . ADHD (attention deficit hyperactivity disorder)   . Anxiety and depression   . 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 09/2013 with [facility] plans to do surgery as of 10/01/13.  . 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 Hematochezia    Dr. 04/2015 recommended colonoscopy 12/2017  . History of shingles   . Tinea versicolor    Responded completely to ketoconazole oral (2018)  . Tobacco dependence   . Wrist fracture, closed 05/2016   left    Past Surgical History:  Procedure Laterality Date  . NO PAST SURGERIES      Outpatient Medications Prior to Visit  Medication Sig Dispense Refill  . dextroamphetamine (DEXEDRINE SPANSULE) 15 MG 24 hr capsule 1 cap po bid 60 capsule 0  . dextroamphetamine (DEXTROSTAT) 10 MG tablet TAKE 2 TABLETS EACH AFTERNOON. 60 tablet 0  . PARoxetine (PAXIL) 20 MG tablet Take 1 tablet (20 mg total) by mouth daily. 90 tablet 3   No facility-administered medications prior to visit.    Allergies  Allergen Reactions  . Nasonex [Mometasone] Other (See Comments)    Nasal irritation/nosebleeds    ROS As per HPI  PE: There were no  vitals taken for this visit. ***  LABS:  NONE  IMPRESSION AND PLAN:  No problem-specific Assessment & Plan notes found for this encounter.   An After Visit Summary was printed and given to the patient.  FOLLOW UP: No follow-ups on file.  Signed:  06/2016, MD           04/10/2020

## 2020-04-13 ENCOUNTER — Encounter: Payer: Self-pay | Admitting: Family Medicine

## 2020-04-13 ENCOUNTER — Other Ambulatory Visit (HOSPITAL_COMMUNITY)
Admission: RE | Admit: 2020-04-13 | Discharge: 2020-04-13 | Disposition: A | Discharge status: Home / Self Care | Payer: 59 | Source: Ambulatory Visit | Attending: Family Medicine | Admitting: Family Medicine

## 2020-04-13 ENCOUNTER — Ambulatory Visit: Payer: Self-pay | Admitting: Family Medicine

## 2020-04-13 ENCOUNTER — Other Ambulatory Visit: Payer: Self-pay

## 2020-04-13 VITALS — BP 136/86 | HR 86 | Temp 98.2°F | Resp 16 | Ht 70.0 in | Wt 165.4 lb

## 2020-04-13 DIAGNOSIS — Z7251 High risk heterosexual behavior: Secondary | ICD-10-CM | POA: Insufficient documentation

## 2020-04-13 DIAGNOSIS — F988 Other specified behavioral and emotional disorders with onset usually occurring in childhood and adolescence: Secondary | ICD-10-CM

## 2020-04-13 DIAGNOSIS — A6001 Herpesviral infection of penis: Secondary | ICD-10-CM | POA: Insufficient documentation

## 2020-04-13 MED ORDER — DEXTROAMPHETAMINE SULFATE ER 15 MG PO CP24: ORAL_CAPSULE | ORAL | 0 refills | Status: DC

## 2020-04-13 MED ORDER — DEXTROAMPHETAMINE SULFATE 10 MG PO TABS: ORAL_TABLET | ORAL | 0 refills | Status: DC

## 2020-04-13 NOTE — Progress Notes (Signed)
OFFICE VISIT  04/16/2020   CC:  Chief Complaint  Patient presents with  . Follow-up    RCI   HPI:    Patient is a 29 y.o. Caucasian male who presents for f/u adult ADD and recurrent MDD. A/P as of last visit: "1) ADHD, combined type.  Too much waning efficacy at end of day. Increase extended release dexedrine to 15 mg strength and he may divide this dose bid if needed. Also continue "plain" dexedrine TWO of the 10mg  tabs per day.  2) MDD, in remission long term. Plan is continue paxil indefinitely.  3) R knee strain, muscular.  No sign of internal derangement or contusion. Improving with rest and elevation, esp the last 1-2 days. Reassured."  INTERIM HX:  Pt states all is going well with the med at current dosing: much improved focus, concentration, task completion.  Less frustration, better multitasking, less impulsivity and restlessness.  Mood is stable. No side effects from the medication. Usually taking dexedrine ext rel 15mg  qam and q afternoon.  Takes a IR dexedrine 10mg  at each dose as well.  Mood: great lately, no problems. Working hard.  PMP AWARE reviewed today: most recent rx for dextreoamph IR and ER were filled 03/20/20, # 60, rx by me. No red flags.   Wants STD testing.  He thinks maybe his girlfriend gave him herpes. Has hx of intermittent/recurrent lesions/shallow ulcers in GU region that hurt--onset about 1 yr ago. Thought maybe they were just "sore areas from when she is on top and she's rough".  He then started to get worried and maybe thinks that he may have herpes. No dysuria or penile d/c.  Past Medical History:  Diagnosis Date  . ADHD (attention deficit hyperactivity disorder)   . Anxiety and depression   . 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 with [facility] plans to do surgery as of 10/01/13.  . 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 Hematochezia    Dr. Mack Hook recommended  colonoscopy 12/2017  . History of shingles   . Tinea versicolor    Responded completely to ketoconazole oral (2018)  . Tobacco dependence   . Wrist fracture, closed 05/2016   left    Past Surgical History:  Procedure Laterality Date  . NO PAST SURGERIES      Outpatient Medications Prior to Visit  Medication Sig Dispense Refill  . PARoxetine (PAXIL) 20 MG tablet Take 1 tablet (20 mg total) by mouth daily. 90 tablet 3  . dextroamphetamine (DEXEDRINE SPANSULE) 15 MG 24 hr capsule 1 cap po bid 60 capsule 0  . dextroamphetamine (DEXTROSTAT) 10 MG tablet TAKE 2 TABLETS EACH AFTERNOON. 60 tablet 0   No facility-administered medications prior to visit.    Allergies  Allergen Reactions  . Nasonex [Mometasone] Other (See Comments)    Nasal irritation/nosebleeds    ROS As per HPI  PE: Vitals with BMI 04/13/2020 12/22/2019 09/29/2019  Height 5\' 10"  5\' 10"  5\' 10"   Weight 165 lbs 6 oz 156 lbs 13 oz 166 lbs 10 oz  BMI 23.73 22.5 23.9  Systolic 136 134 04/15/2020  Diastolic 86 69 71  Pulse 86 81 97  Some encounter information is confidential and restricted. Go to Review Flowsheets activity to see all data.  O2 sat on RA today is 100%  Gen: Alert, well appearing.  Patient is oriented to person, place, time, and situation. AFFECT: pleasant, lucid thought and speech. GU:  no vesicles, ulcerations, or warts. No penile d/c.  No LAD.  LABS:  none  IMPRESSION AND PLAN:  1) Adult ADD: The current medical regimen is effective;  continue present plan and medications. I did electronic rx's for dexedrine 15mg  ext rel, 1 bid, #60 and dexedrine IR 10mg  bid, #60 today for each of the next 3 mo.  Appropriate fill on/after date was noted on each rx.  2) STD exposure possible/high risk sexual activity: suspect herpes genitalis by the history he gives today.  Exam not very helpful at this time-no active lesion present. Check HSV IgG, as well as urine GC/chlaymida, RPR, and HIV w/reflex. If HSV IgG elevated  then will start daily valtrex suppression.  An After Visit Summary was printed and given to the patient.  FOLLOW UP: Return in about 3 months (around 07/14/2020) for f/u add.  Signed:  , MD           04/16/2020

## 2020-04-14 LAB — HSV(HERPES SIMPLEX VRS) I + II AB-IGG
HAV 1 IGG,TYPE SPECIFIC AB: <0.9 index
HSV 2 IGG,TYPE SPECIFIC AB: <0.9 index

## 2020-04-14 LAB — HIV ANTIBODY (ROUTINE TESTING W REFLEX): HIV 1&2 Ab, 4th Generation: NONREACTIVE

## 2020-04-14 LAB — RPR: RPR Ser Ql: NONREACTIVE

## 2020-04-17 LAB — URINE CYTOLOGY ANCILLARY ONLY
Chlamydia: NEGATIVE
Comment: NEGATIVE
Comment: NORMAL
Neisseria Gonorrhea: NEGATIVE

## 2020-05-10 ENCOUNTER — Other Ambulatory Visit: Payer: Self-pay | Admitting: Family Medicine

## 2020-05-24 DIAGNOSIS — U071 COVID-19: Secondary | ICD-10-CM

## 2020-05-24 DIAGNOSIS — J1282 Pneumonia due to coronavirus disease 2019: Secondary | ICD-10-CM

## 2020-05-24 HISTORY — DX: Pneumonia due to coronavirus disease 2019: J12.82

## 2020-05-24 HISTORY — DX: COVID-19: U07.1

## 2020-06-01 ENCOUNTER — Other Ambulatory Visit: Payer: Self-pay

## 2020-06-01 DIAGNOSIS — Z20822 Contact with and (suspected) exposure to covid-19: Secondary | ICD-10-CM

## 2020-06-03 LAB — SARS-COV-2, NAA 2 DAY TAT

## 2020-06-03 LAB — NOVEL CORONAVIRUS, NAA: SARS-CoV-2, NAA: DETECTED — AB

## 2020-06-05 ENCOUNTER — Encounter: Payer: Self-pay | Admitting: Family Medicine

## 2020-07-11 ENCOUNTER — Other Ambulatory Visit: Payer: Self-pay | Admitting: Family Medicine

## 2020-07-12 ENCOUNTER — Telehealth: Payer: Self-pay | Admitting: Family Medicine

## 2020-07-12 NOTE — Telephone Encounter (Signed)
Requesting: dexedrine; dextrostat Contract:04/13/20 UDS:03/20/20 Last Visit:04/13/20 Next Visit:07/31/20 Last Refill: 04/13/20 (60,0)(60,0)  Please Advise

## 2020-07-12 NOTE — Telephone Encounter (Signed)
Patient called to request refills of Dexedrine and Dextrostat, states he will run out Sunday 07/16/20. He has appt scheduled for 07/31/20.

## 2020-07-13 MED ORDER — DEXTROAMPHETAMINE SULFATE ER 15 MG PO CP24: ORAL_CAPSULE | ORAL | 0 refills | Status: DC

## 2020-07-13 MED ORDER — DEXTROAMPHETAMINE SULFATE 10 MG PO TABS: ORAL_TABLET | ORAL | 0 refills | Status: DC

## 2020-07-13 NOTE — Telephone Encounter (Signed)
Left detailed message advising refills sent for requested medications. Okay per dpr

## 2020-07-13 NOTE — Addendum Note (Signed)
Addended by: Jeoffrey Massed on: 07/13/2020 09:04 AM   Modules accepted: Orders

## 2020-07-13 NOTE — Telephone Encounter (Signed)
Dexedrine and dextrostat rx's sent in

## 2020-07-31 ENCOUNTER — Ambulatory Visit (INDEPENDENT_AMBULATORY_CARE_PROVIDER_SITE_OTHER): Payer: BC Managed Care – PPO | Admitting: Family Medicine

## 2020-07-31 ENCOUNTER — Other Ambulatory Visit: Payer: Self-pay

## 2020-07-31 ENCOUNTER — Encounter: Payer: Self-pay | Admitting: Family Medicine

## 2020-07-31 VITALS — BP 126/89 | HR 94 | Wt 164.8 lb

## 2020-07-31 DIAGNOSIS — F988 Other specified behavioral and emotional disorders with onset usually occurring in childhood and adolescence: Secondary | ICD-10-CM

## 2020-07-31 DIAGNOSIS — F3342 Major depressive disorder, recurrent, in full remission: Secondary | ICD-10-CM | POA: Diagnosis not present

## 2020-07-31 DIAGNOSIS — H5712 Ocular pain, left eye: Secondary | ICD-10-CM

## 2020-07-31 MED ORDER — DEXTROAMPHETAMINE SULFATE ER 15 MG PO CP24
ORAL_CAPSULE | ORAL | 0 refills | Status: DC
Start: 2020-07-31 — End: 2020-07-31

## 2020-07-31 MED ORDER — DEXTROAMPHETAMINE SULFATE 10 MG PO TABS: ORAL_TABLET | ORAL | 0 refills | Status: DC

## 2020-07-31 MED ORDER — DEXTROAMPHETAMINE SULFATE 10 MG PO TABS
ORAL_TABLET | ORAL | 0 refills | Status: DC
Start: 2020-07-31 — End: 2020-07-31

## 2020-07-31 MED ORDER — DEXTROAMPHETAMINE SULFATE ER 15 MG PO CP24
ORAL_CAPSULE | ORAL | 0 refills | Status: DC
Start: 2020-07-31 — End: 2020-11-08

## 2020-07-31 MED ORDER — DEXTROAMPHETAMINE SULFATE 10 MG PO TABS
ORAL_TABLET | ORAL | 0 refills | Status: DC
Start: 2020-07-31 — End: 2020-11-08

## 2020-07-31 MED ORDER — DEXTROAMPHETAMINE SULFATE ER 15 MG PO CP24: ORAL_CAPSULE | ORAL | 0 refills | Status: DC

## 2020-07-31 NOTE — Progress Notes (Signed)
OFFICE VISIT  07/31/2020  CC:  Chief Complaint  Patient presents with  . Follow-up    medication, cyst now gone    HPI:    Patient is a 29 y.o. Caucasian male who presents for 3 mo f/u ADD.  A/P as of last visit: "1) Adult ADD: The current medical regimen is effective;  continue present plan and medications. I did electronic rx's for dexedrine 15mg  ext rel, 1 bid, #60 and dexedrine IR 10mg  bid, #60 today for each of the next 3 mo.  Appropriate fill on/after date was noted on each rx.  2) STD exposure possible/high risk sexual activity: suspect herpes genitalis by the history he gives today.  Exam not very helpful at this time-no active lesion present. Check HSV IgG, as well as urine GC/chlaymida, RPR, and HIV w/reflex. If HSV IgG elevated then will start daily valtrex suppression."  INTERIM HX: All STD testing last visit was NEGATIVE. 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  Pt states all is going well with the med at current dosing: much improved focus, concentration, task completion.  Less frustration, better multitasking, less impulsivity and restlessness.  Mood is stable--taking paxil 20mg  qd w/out prob--long term. No side effects from the medication.  Recent L eye pain when a box hit him while at work, had to wear a patch for a day or so. Still bothers him intermittently.  No swelling, redness, d/c, or vision complaints.  PMP AWARE reviewed today: most recent rx for dexedrine er and dexedrine plain was filled 07/16/20, # 60 of each, rx by me. No red flags.   Past Medical History:  Diagnosis Date  . ADHD (attention deficit hyperactivity disorder)   . Anxiety and depression   . 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 with [facility] plans to do surgery as of 10/01/13.  . 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 Hematochezia    Dr. Mack Hook recommended colonoscopy 12/2017  . History  of shingles   . Pneumonia due to COVID-19 virus 05/2020  . Tinea versicolor    Responded completely to ketoconazole oral (2018)  . Tobacco dependence   . Wrist fracture, closed 05/2016   left    Past Surgical History:  Procedure Laterality Date  . NO PAST SURGERIES      Outpatient Medications Prior to Visit  Medication Sig Dispense Refill  . PARoxetine (PAXIL) 20 MG tablet TAKE 1 TABLET BY MOUTH EVERY DAY 30 tablet 1  . dextroamphetamine (DEXEDRINE SPANSULE) 15 MG 24 hr capsule 1 cap po bid 60 capsule 0  . dextroamphetamine (DEXTROSTAT) 10 MG tablet TAKE 2 TABLETS EACH AFTERNOON. 60 tablet 0   No facility-administered medications prior to visit.    Allergies  Allergen Reactions  . Nasonex [Mometasone] Other (See Comments)    Nasal irritation/nosebleeds    ROS As per HPI  PE: Vitals with BMI 07/31/2020 04/13/2020 12/22/2019  Height - 5\' 10"  5\' 10"   Weight 164 lbs 13 oz 165 lbs 6 oz 156 lbs 13 oz  BMI - 23.73 22.5  Systolic 126 136 13/04/2020  Diastolic 89 86 69  Pulse 94 86 81  Some encounter information is confidential and restricted. Go to Review Flowsheets activity to see all data.    Wt Readings from Last 2 Encounters:  07/31/20 164 lb 12.8 oz (74.8 kg)  04/13/20 165 lb 6.4 oz (75 kg)    Gen: alert,  oriented x 4, affect pleasant.  Lucid thinking and conversation noted. HEENT: PERRLA, EOMI.   Neck: no LAD, mass, or thyromegaly. CV: RRR, no m/r/g LUNGS: CTA bilat, nonlabored. NEURO: no tremor or tics noted on observation.  Coordination intact. CN 2-12 grossly intact bilaterally, strength 5/5 in all extremeties.  No ataxia.   LABS:    Chemistry      Component Value Date/Time   NA 138 11/12/2017 1026   K 4.3 11/12/2017 1026   CL 101 11/12/2017 1026   CO2 31 11/12/2017 1026   BUN 11 11/12/2017 1026   CREATININE 0.93 11/12/2017 1026      Component Value Date/Time   CALCIUM 9.8 11/12/2017 1026   ALKPHOS 56 11/12/2017 1026   AST 14 11/12/2017 1026   ALT 14  11/12/2017 1026   BILITOT 0.7 11/12/2017 1026     IMPRESSION AND PLAN:  1) Adult ADD: The current medical regimen is effective;  continue present plan and medications. I did electronic rx's for dexedrin spansule 15mg , 1 bid, #60 and dextrostat 10mg , 1 bid, #60 today for each of the next 3 mo.  Appropriate fill on/after date was noted on each rx.  2) Recurrent MDD: stable. Cont paxil 20mg  qd indefinitely.  3) L eye pain intermittently x 2 wks or so: describes recent corneal abrasion-type injury vs focal irritation of palpebral/bulbar conjunctiva from a box at work. Eye exam normal today. Should gradually return to normal/no pain or discomfort.  An After Visit Summary was printed and given to the patient.  FOLLOW UP: Return in about 3 months (around 10/31/2020) for f/u ADD.  Signed:  , MD           07/31/2020

## 2020-09-07 ENCOUNTER — Other Ambulatory Visit: Payer: Self-pay | Admitting: Family Medicine

## 2020-11-07 ENCOUNTER — Other Ambulatory Visit: Payer: Self-pay | Admitting: Family Medicine

## 2020-11-07 NOTE — Telephone Encounter (Signed)
Patient requesting refill of Dexedrine. Has appt 11/15/20 but will run out 11/12/20. Please send to same [facility] in [place].

## 2020-11-08 MED ORDER — DEXTROAMPHETAMINE SULFATE ER 15 MG PO CP24
ORAL_CAPSULE | ORAL | 0 refills | Status: DC
Start: 2020-11-08 — End: 2020-12-04

## 2020-11-08 MED ORDER — DEXTROAMPHETAMINE SULFATE 10 MG PO TABS
ORAL_TABLET | ORAL | 0 refills | Status: DC
Start: 2020-11-08 — End: 2020-12-04

## 2020-11-08 NOTE — Telephone Encounter (Signed)
Requesting:dextroamphetamine (DEXEDRINE SPANSULE) 15 MG 24 hr capsule, dextroamphetamine (DEXTROSTAT) 10 MG tablet Contract: 04/18/20 UDS: 03/20/2018 Last Visit: 07/31/20 Next Visit:11/16/19  Last Refill: 07/31/20  Please Advise

## 2020-11-14 ENCOUNTER — Other Ambulatory Visit: Payer: Self-pay

## 2020-11-15 ENCOUNTER — Ambulatory Visit: Payer: PRIVATE HEALTH INSURANCE | Admitting: Family Medicine

## 2020-11-15 NOTE — Progress Notes (Deleted)
OFFICE VISIT  11/15/2020  CC: No chief complaint on file.   HPI:    Patient is a 30 y.o. Caucasian [place] who presents for f/u adult ADD and hx of recurrent MDD. A/P as of last visit: ") Adult ADD: The current medical regimen is effective;  continue present plan and medications. I did electronic rx's for dexedrin spansule 15mg , 1 bid, #60 and dextrostat 10mg , 1 bid, #60 today for each of the next 3 mo.  Appropriate fill on/after date was noted on each rx.  2) Recurrent MDD: stable. Cont paxil 20mg  qd indefinitely.  3) L eye pain intermittently x 2 wks or so: describes recent corneal abrasion-type injury vs focal irritation of palpebral/bulbar conjunctiva from a box at work. Eye exam normal today. Should gradually return to normal/no pain or discomfort."  INTERIM HX: ***   PMP AWARE reviewed today: most recent rx for *** was filled ***, # ***, rx by ***. No red flags.  Past Medical History:  Diagnosis Date  . ADHD (attention deficit hyperactivity disorder)   . Anxiety and depression   . 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 with [facility] plans to do surgery as of 10/01/13.  . 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 Hematochezia    Dr. Mack Hook recommended colonoscopy 12/2017  . History of shingles   . Pneumonia due to COVID-19 virus 05/2020  . Tinea versicolor    Responded completely to ketoconazole oral (2018)  . Tobacco dependence   . Wrist fracture, closed 05/2016   left    Past Surgical History:  Procedure Laterality Date  . NO PAST SURGERIES      Outpatient Medications Prior to Visit  Medication Sig Dispense Refill  . dextroamphetamine (DEXEDRINE SPANSULE) 15 MG 24 hr capsule 1 cap po bid 60 capsule 0  . dextroamphetamine (DEXTROSTAT) 10 MG tablet TAKE 2 TABLETS EACH AFTERNOON. 60 tablet 0  . PARoxetine (PAXIL) 20 MG tablet TAKE 1 TABLET BY MOUTH EVERY DAY 90 tablet 0   No facility-administered  medications prior to visit.    Allergies  Allergen Reactions  . Nasonex [Mometasone] Other (See Comments)    Nasal irritation/nosebleeds    ROS As per HPI  PE: Vitals with BMI 07/31/2020 04/13/2020 12/22/2019  Height - 5\' 10"  5\' 10"   Weight 164 lbs 13 oz 165 lbs 6 oz 156 lbs 13 oz  BMI - 23.73 22.5  Systolic 126 136 13/04/2020  Diastolic 89 86 69  Pulse 94 86 81  Some encounter information is confidential and restricted. Go to Review Flowsheets activity to see all data.     ***  LABS:  none  IMPRESSION AND PLAN:  No problem-specific Assessment & Plan notes found for this encounter.  Send in rx's for mar and apr  An After Visit Summary was printed and given to the patient.  FOLLOW UP: No follow-ups on file.  Signed:  04/15/2020, MD           11/15/2020

## 2020-12-04 ENCOUNTER — Encounter: Payer: Self-pay | Admitting: Family Medicine

## 2020-12-04 ENCOUNTER — Other Ambulatory Visit: Payer: Self-pay

## 2020-12-04 ENCOUNTER — Ambulatory Visit (INDEPENDENT_AMBULATORY_CARE_PROVIDER_SITE_OTHER): Payer: BC Managed Care – PPO | Admitting: Family Medicine

## 2020-12-04 VITALS — BP 120/80 | HR 91 | Temp 98.2°F | Resp 16 | Ht 70.0 in | Wt 172.0 lb

## 2020-12-04 DIAGNOSIS — Z79899 Other long term (current) drug therapy: Secondary | ICD-10-CM | POA: Diagnosis not present

## 2020-12-04 DIAGNOSIS — F988 Other specified behavioral and emotional disorders with onset usually occurring in childhood and adolescence: Secondary | ICD-10-CM | POA: Diagnosis not present

## 2020-12-04 MED ORDER — DEXTROAMPHETAMINE SULFATE 10 MG PO TABS: ORAL_TABLET | ORAL | 0 refills | Status: DC

## 2020-12-04 MED ORDER — MUPIROCIN 2 % EX OINT: TOPICAL_OINTMENT | CUTANEOUS | 0 refills | Status: DC

## 2020-12-04 MED ORDER — DEXTROAMPHETAMINE SULFATE 10 MG PO TABS
ORAL_TABLET | ORAL | 0 refills | Status: DC
Start: 2020-12-04 — End: 2020-12-04

## 2020-12-04 MED ORDER — DEXTROAMPHETAMINE SULFATE ER 15 MG PO CP24
ORAL_CAPSULE | ORAL | 0 refills | Status: DC
Start: 2020-12-04 — End: 2020-12-04

## 2020-12-04 MED ORDER — DEXTROAMPHETAMINE SULFATE ER 15 MG PO CP24: ORAL_CAPSULE | ORAL | 0 refills | Status: DC

## 2020-12-04 NOTE — Progress Notes (Signed)
OFFICE VISIT  12/04/2020  CC:  Chief Complaint  Patient presents with  . Follow-up    ADD    HPI:    Patient is a 30 y.o. Caucasian male who presents for 4 mo f/u adult ADD and recurrent MDD. A/P as of last visit: "1) Adult ADD: The current medical regimen is effective;  continue present plan and medications. I did electronic rx's for dexedrin spansule 15mg , 1 bid, #60 and dextrostat 10mg , 1 bid, #60 today for each of the next 3 mo.  Appropriate fill on/after date was noted on each rx.  2) Recurrent MDD: stable. Cont paxil 20mg  qd indefinitely.  3) L eye pain intermittently x 2 wks or so: describes recent corneal abrasion-type injury vs focal irritation of palpebral/bulbar conjunctiva from a box at work. Eye exam normal today. Should gradually return to normal/no pain or discomfort."  INTERIM HX: Doing well. 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 Says it feels like it started again this morning.  Tiny bump, no drainage.    PMP AWARE reviewed today: most recent rx for dexedrin ER 15mg  was filled 11/15/20, # 60, rx by me. Most recent dexedrine IR 10mg  rx filled 11/12/20, #60, rx by me. No red flags.  Past Medical History:  Diagnosis Date  . ADHD (attention deficit hyperactivity disorder)   . Anxiety and depression   . 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 with [facility] plans to do surgery as of 10/01/13.  . 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 Hematochezia    Dr. 11/14/20 recommended colonoscopy 12/2017  . History of shingles   . Pneumonia due to COVID-19 virus 05/2020  . Tinea versicolor    Responded completely to ketoconazole oral (2018)  . Tobacco dependence   . Wrist fracture, closed 05/2016   left    Past Surgical History:  Procedure Laterality Date  . NO PAST SURGERIES      Outpatient Medications Prior to Visit  Medication Sig Dispense Refill  . PARoxetine  (PAXIL) 20 MG tablet TAKE 1 TABLET BY MOUTH EVERY DAY 90 tablet 0  . dextroamphetamine (DEXEDRINE SPANSULE) 15 MG 24 hr capsule 1 cap po bid 60 capsule 0  . dextroamphetamine (DEXTROSTAT) 10 MG tablet TAKE 2 TABLETS EACH AFTERNOON. 60 tablet 0   No facility-administered medications prior to visit.    Allergies  Allergen Reactions  . Nasonex [Mometasone] Other (See Comments)    Nasal irritation/nosebleeds    ROS As per HPI  PE: Vitals with BMI 12/04/2020 07/31/2020 04/13/2020  Height 5\' 10"  - 5\' 10"   Weight 172 lbs 164 lbs 13 oz 165 lbs 6 oz  BMI 24.68 - 23.73  Systolic 120 126 10-21-1978  Diastolic 80 89 86  Pulse 91 94 86  Some encounter information is confidential and restricted. Go to Review Flowsheets activity to see all data.   Wt Readings from Last 2 Encounters:  12/04/20 172 lb (78 kg)  07/31/20 164 lb 12.8 oz (74.8 kg)    Gen: alert, oriented x 4, affect pleasant.  Lucid thinking and conversation noted. HEENT: PERRLA, EOMI.   Small spot on occiput that is a bit erythematous, looks like focal folliculitis.  No pustule or vesicle or cyst is palpable. Neck: no LAD, mass, or thyromegaly. CV: RRR, no m/r/g LUNGS: CTA bilat, nonlabored. NEURO: no tremor or tics noted on observation.  Coordination intact. CN 2-12 grossly intact bilaterally,  strength 5/5 in all extremeties.  No ataxia.   LABS:  NONE  IMPRESSION AND PLAN:  1) Adult ADD.  Stable. UDS today. CSC updated today. I did electronic rx's for dexedrine ER 15mg  and dexedrine IR 10mg  (#60 of each) today for each of the next 3 mo.  Appropriate fill on/after date was noted on each rx.  2) Recurrent MDD: stable/in remission. Cont paxil 20mg  qd indefinitely.  3) Scalp lesion: question folliculitis vs nonspecific epidermal inclusion cyst (beginning). Trial of bactroban ointment rx'd today.  An After Visit Summary was printed and given to the patient.  FOLLOW UP: Return in about 3 months (around 03/06/2021) for routine  chronic illness f/u.  Signed:  , MD           12/04/2020

## 2020-12-05 ENCOUNTER — Other Ambulatory Visit: Payer: Self-pay

## 2020-12-05 ENCOUNTER — Other Ambulatory Visit: Payer: Self-pay | Admitting: Family Medicine

## 2020-12-05 MED ORDER — PAROXETINE HCL 20 MG PO TABS: 20.0000 mg | ORAL_TABLET | Freq: Every day | ORAL | 0 refills | Status: DC

## 2020-12-07 LAB — DRUG MONITORING, PANEL 8 WITH CONFIRMATION, URINE
6 Acetylmorphine: NEGATIVE ng/mL (ref <10)
Alcohol Metabolites: POSITIVE ng/mL — AB
Amphetamine: 12472 ng/mL — ABNORMAL HIGH (ref <250)
Amphetamines: POSITIVE ng/mL — AB (ref <500)
Benzodiazepines: NEGATIVE ng/mL (ref <100)
Buprenorphine, Urine: NEGATIVE ng/mL (ref <5)
Cocaine Metabolite: NEGATIVE ng/mL (ref <150)
Creatinine: 147.4 mg/dL
Ethyl Glucuronide (ETG): 31264 ng/mL — ABNORMAL HIGH (ref <500)
Ethyl Sulfate (ETS): 12508 ng/mL — ABNORMAL HIGH (ref <100)
MDMA: NEGATIVE ng/mL (ref <500)
Marijuana Metabolite: NEGATIVE ng/mL (ref <20)
Methamphetamine: NEGATIVE ng/mL (ref <250)
Opiates: NEGATIVE ng/mL (ref <100)
Oxidant: NEGATIVE ug/mL
Oxycodone: NEGATIVE ng/mL (ref <100)
pH: 5.8 (ref 4.5–9.0)

## 2020-12-07 LAB — DM TEMPLATE

## 2021-02-01 ENCOUNTER — Ambulatory Visit: Payer: PRIVATE HEALTH INSURANCE | Attending: Internal Medicine

## 2021-02-01 DIAGNOSIS — Z20822 Contact with and (suspected) exposure to covid-19: Secondary | ICD-10-CM

## 2021-02-02 LAB — NOVEL CORONAVIRUS, NAA: SARS-CoV-2, NAA: NOT DETECTED

## 2021-02-02 LAB — SARS-COV-2, NAA 2 DAY TAT

## 2021-03-08 ENCOUNTER — Other Ambulatory Visit: Payer: Self-pay

## 2021-03-08 ENCOUNTER — Encounter: Payer: Self-pay | Admitting: Family Medicine

## 2021-03-08 ENCOUNTER — Ambulatory Visit: Payer: BC Managed Care – PPO | Admitting: Family Medicine

## 2021-03-08 VITALS — BP 120/79 | HR 87 | Temp 98.3°F | Resp 16 | Ht 70.0 in | Wt 166.0 lb

## 2021-03-08 DIAGNOSIS — F988 Other specified behavioral and emotional disorders with onset usually occurring in childhood and adolescence: Secondary | ICD-10-CM

## 2021-03-08 DIAGNOSIS — J309 Allergic rhinitis, unspecified: Secondary | ICD-10-CM

## 2021-03-08 DIAGNOSIS — F3342 Major depressive disorder, recurrent, in full remission: Secondary | ICD-10-CM

## 2021-03-08 MED ORDER — DEXTROAMPHETAMINE SULFATE ER 15 MG PO CP24: ORAL_CAPSULE | ORAL | 0 refills | Status: DC

## 2021-03-08 MED ORDER — DEXTROAMPHETAMINE SULFATE 10 MG PO TABS: ORAL_TABLET | ORAL | 0 refills | Status: DC

## 2021-03-08 MED ORDER — FLUTICASONE PROPIONATE 50 MCG/ACT NA SUSP: 2.0000 | Freq: Every day | NASAL | 11 refills | Status: DC

## 2021-03-08 NOTE — Progress Notes (Addendum)
OFFICE VISIT  03/08/2021  CC:  Chief Complaint  Patient presents with   Follow-up    Adult ADD    HPI:    Patient is a 30 y.o. Caucasian male who presents for 3 mo f/u adult ADD and hx of recurrent MDD. A/P as of last visit: "1) Adult ADD.  Stable. UDS today. CSC updated today. I did electronic rx's for dexedrine ER 15mg  and dexedrine IR 10mg  (#60 of each) today for each of the next 3 mo.  Appropriate fill on/after date was noted on each rx.   2) Recurrent MDD: stable/in remission. Cont paxil 20mg  qd indefinitely.   3) Scalp lesion: question folliculitis vs nonspecific epidermal inclusion cyst (beginning). Trial of bactroban ointment rx'd today."  INTERIM HX: Feeling well.  Working hard full time at in warehouse, 2nd shift. Has chronic rhinitis, lots of nasal congestion, some sinus HAs lately.  Taking allegra D on prn basis, is on saline nasal spray but not steroid nasal spray.  ADD: Pt states all is going well with the med at current dosing: much improved focus, concentration, task completion.  Less frustration, better multitasking, less impulsivity and restlessness.  Mood is stable. No side effects from the medication. Taking paxil HALF of 20mg  tab qd.  He felt more complacent on 20mg  dose.  Ont 10mg  daily dose pt feels improved and not wanting to sleep as much and is has better range of emotions.   PMP AWARE reviewed today: most recent rx for dexedrine spansules 15mg  was filled 02/10/21, # 60, rx by me.  Most recent dexedrine short acting 10mg  rx filled5/21/22, #60, rx by me. No red flags.   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 with [facility] plans to do surgery as of 10/01/13.   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 recommended colonoscopy 12/2017   History  of shingles    Pneumonia due to COVID-19 virus 05/2020   Tinea versicolor    Responded completely to ketoconazole oral (2018)   Tobacco dependence    Wrist fracture, closed 05/2016   left    Past Surgical History:  Procedure Laterality Date   NO PAST SURGERIES      Outpatient Medications Prior to Visit  Medication Sig Dispense Refill   dextroamphetamine (DEXEDRINE SPANSULE) 15 MG 24 hr capsule 1 cap po bid 60 capsule 0   dextroamphetamine (DEXTROSTAT) 10 MG tablet TAKE 2 TABLETS EACH AFTERNOON. 60 tablet 0   mupirocin ointment (BACTROBAN) 2 % Apply to affected area of scalp three times per day for 10d (Patient not taking: Reported on 03/08/2021) 15 g 0   PARoxetine (PAXIL) 20 MG tablet Take 1 tablet (20 mg total) by mouth daily. (Patient taking differently: Take 10 mg by mouth daily.) 90 tablet 0   No facility-administered medications prior to visit.    Allergies  Allergen Reactions   Nasonex [Mometasone] Other (See Comments)    Nasal irritation/nosebleeds    ROS As per HPI  PE: Vitals with BMI 03/08/2021 12/04/2020 07/31/2020  Height 5\' 10"  5\' 10"  -  Weight 166 lbs 172 lbs 164 lbs 13 oz  BMI 23.82 24.68 -  Systolic 120 120 01/2018  Diastolic 79 80 89  Pulse 87 91 94  Some encounter information is confidential and  restricted. Go to Review Flowsheets activity to see all data.     Wt Readings from Last 2 Encounters:  03/08/21 166 lb (75.3 kg)  12/04/20 172 lb (78 kg)    Gen: alert, oriented x 4, affect pleasant.  Lucid thinking and conversation noted. HEENT: PERRLA, EOMI.   Neck: no LAD, mass, or thyromegaly. CV: RRR, no m/r/g LUNGS: CTA bilat, nonlabored. NEURO: no tremor or tics noted on observation.  Coordination intact. CN 2-12 grossly intact bilaterally, strength 5/5 in all extremeties.  No ataxia.   LABS:  none  IMPRESSION AND PLAN:  1) Adult ADD: doing well on dexedrine spansules 15mg  bid and dexedrine IR/short acting 10mg  2 tabs qd. I did electronic rx's  for each of these meds today for each of the next 3 mo.  Appropriate fill on/after date was noted on each rx. CSC and UDS UTD.  2) Recurrent MDD, in full remission. On paxil longterm, doing well on 1/2 of 20mg  tab qd.  3) Allergic rhinitis: trial of flonase. He is considering seeing an ENT b/c he feels like he needs sinus surgery.  He'll call here for referral order if the ENT office tells him he needs one.  An After Visit Summary was printed and given to the patient.  FOLLOW UP: Return in about 3 months (around 06/08/2021) for routine chronic illness f/u.  Signed:  , MD           03/08/2021

## 2021-05-29 ENCOUNTER — Other Ambulatory Visit: Payer: Self-pay | Admitting: Family Medicine

## 2021-05-31 ENCOUNTER — Other Ambulatory Visit: Payer: Self-pay

## 2021-05-31 ENCOUNTER — Telehealth: Payer: Self-pay

## 2021-05-31 MED ORDER — DEXTROAMPHETAMINE SULFATE ER 15 MG PO CP24: ORAL_CAPSULE | ORAL | 0 refills | Status: DC

## 2021-05-31 MED ORDER — DEXTROAMPHETAMINE SULFATE 10 MG PO TABS: ORAL_TABLET | ORAL | 0 refills | Status: DC

## 2021-05-31 NOTE — Telephone Encounter (Signed)
A user error has taken place: encounter opened in error, closed for administrative reasons.

## 2021-05-31 NOTE — Telephone Encounter (Signed)
Requesting: dexedrine 15&10 Contract:04/13/20 UDS:12/04/20 Last Visit:03/08/21 Next Visit: 06/14/21 Last Refill: 03/08/21 (60,0)  Please Advise

## 2021-06-14 ENCOUNTER — Ambulatory Visit: Payer: PRIVATE HEALTH INSURANCE | Admitting: Family Medicine

## 2021-06-14 NOTE — Progress Notes (Deleted)
OFFICE VISIT  06/14/2021  CC: No chief complaint on file.   HPI:    Patient is a 30 y.o. Caucasian male who presents for 3 mo f/u adult ADD and recurrent MDD. A/P as of last visit: "1) Adult ADD: doing well on dexedrine spansules 15mg  bid and dexedrine IR/short acting 10mg  2 tabs qd. I did electronic rx's for each of these meds today for each of the next 3 mo.  Appropriate fill on/after date was noted on each rx. CSC and UDS UTD.   2) Recurrent MDD, in full remission. On paxil longterm, doing well on 1/2 of 20mg  tab qd.   3) Allergic rhinitis: trial of flonase. He is considering seeing an ENT b/c he feels like he needs sinus surgery.  He'll call here for referral order if the ENT office tells him he needs one."  INTERIM HX: ***   PMP AWARE reviewed today: most recent rx for dexedrin ER and dexedrine IR/plain were filled 06/08/21, # 60 each, rx by me. No red flags.   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 with [facility] plans to do surgery as of 10/01/13.   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 Mack Hook recommended colonoscopy 12/2017   History of shingles    Pneumonia due to COVID-19 virus 05/2020   Tinea versicolor    Responded completely to ketoconazole oral (2018)   Tobacco dependence    Wrist fracture, closed 05/2016   left    Past Surgical History:  Procedure Laterality Date   NO PAST SURGERIES      Outpatient Medications Prior to Visit  Medication Sig Dispense Refill   dextroamphetamine (DEXEDRINE SPANSULE) 15 MG 24 hr capsule 1 cap po bid 60 capsule 0   dextroamphetamine (DEXTROSTAT) 10 MG tablet TAKE 2 TABLETS EACH AFTERNOON. 60 tablet 0   fluticasone (FLONASE) 50 MCG/ACT nasal spray Place 2 sprays into both nostrils daily. 16 g 11   mupirocin ointment  (BACTROBAN) 2 % Apply to affected area of scalp three times per day for 10d (Patient not taking: Reported on 03/08/2021) 15 g 0   PARoxetine (PAXIL) 20 MG tablet Take 1 tablet (20 mg total) by mouth daily. 30 tablet 0   No facility-administered medications prior to visit.    Allergies  Allergen Reactions   Nasonex [Mometasone] Other (See Comments)    Nasal irritation/nosebleeds    ROS As per HPI  PE: Vitals with BMI 03/08/2021 12/04/2020 07/31/2020  Height 5\' 10"  5\' 10"  -  Weight 166 lbs 172 lbs 164 lbs 13 oz  BMI 23.82 24.68 -  Systolic 120 120 03/10/2021  Diastolic 79 80 89  Pulse 87 91 94  Some encounter information is confidential and restricted. Go to Review Flowsheets activity to see all data.     ***  LABS:  none  IMPRESSION AND PLAN:  No problem-specific Assessment & Plan notes found for this encounter.   An After Visit Summary was printed and given to the patient.  FOLLOW UP: No follow-ups on file.  Signed:  12/06/2020, MD           06/14/2021

## 2021-06-28 ENCOUNTER — Telehealth: Payer: Self-pay

## 2021-06-28 ENCOUNTER — Other Ambulatory Visit: Payer: Self-pay

## 2021-06-28 ENCOUNTER — Ambulatory Visit: Payer: BC Managed Care – PPO | Admitting: Family Medicine

## 2021-06-28 VITALS — BP 106/69 | HR 80 | Temp 98.1°F | Ht 70.0 in | Wt 165.0 lb

## 2021-06-28 DIAGNOSIS — T50905A Adverse effect of unspecified drugs, medicaments and biological substances, initial encounter: Secondary | ICD-10-CM | POA: Diagnosis not present

## 2021-06-28 DIAGNOSIS — F3342 Major depressive disorder, recurrent, in full remission: Secondary | ICD-10-CM

## 2021-06-28 DIAGNOSIS — J0191 Acute recurrent sinusitis, unspecified: Secondary | ICD-10-CM | POA: Diagnosis not present

## 2021-06-28 DIAGNOSIS — F988 Other specified behavioral and emotional disorders with onset usually occurring in childhood and adolescence: Secondary | ICD-10-CM | POA: Diagnosis not present

## 2021-06-28 MED ORDER — PREDNISONE 20 MG PO TABS: ORAL_TABLET | ORAL | 0 refills | Status: DC

## 2021-06-28 MED ORDER — AZITHROMYCIN 250 MG PO TABS: ORAL_TABLET | ORAL | 0 refills | Status: DC

## 2021-06-28 MED ORDER — DEXTROAMPHETAMINE SULFATE 10 MG PO TABS: ORAL_TABLET | ORAL | 0 refills | Status: DC

## 2021-06-28 MED ORDER — DEXTROAMPHETAMINE SULFATE ER 15 MG PO CP24: ORAL_CAPSULE | ORAL | 0 refills | Status: DC

## 2021-06-28 MED ORDER — BUPROPION HCL ER (XL) 150 MG PO TB24: 150.0000 mg | ORAL_TABLET | Freq: Every day | ORAL | 3 refills | Status: DC

## 2021-06-28 NOTE — Telephone Encounter (Signed)
Kim at [facility] called to discuss recent Rx that was sent to pharmacy. Pt told her that he has always had Brand name Rx. Pt has generic Rx for the following:  dextroamphetamine (DEXEDRINE SPANSULE) 15 MG 24 hr capsule  0 ordered       dextroamphetamine (DEXTROSTAT) 10 MG tablet         Annabelle Harman spoke with PCP about Rx and PCP stated it is okay for pt to have generic Rx. Dana notified pharmacy.   I spoke with Selena Batten also and was notified that pt is aware that he has had generic Rx and that Rx will be continued as is unless Rx is changed to different pharmacy. Pt agreed to keep Rx as is.

## 2021-06-28 NOTE — Progress Notes (Signed)
OFFICE VISIT  06/28/2021  CC:  Chief Complaint  Patient presents with   Follow-up    HPI:    Patient is a 30 y.o. male who presents for f/u adult ADD and hx of recurrent MDD in long term remission on maintenance SSRI. I last saw him about 4 mo ago. A/P as of that visit: "1) Adult ADD: doing well on dexedrine spansules 15mg  bid and dexedrine IR/short acting 10mg  2 tabs qd. I did electronic rx's for each of these meds today for each of the next 3 mo.  Appropriate fill on/after date was noted on each rx. CSC and UDS UTD.   2) Recurrent MDD, in full remission. On paxil longterm, doing well on 1/2 of 20mg  tab qd.   3) Allergic rhinitis: trial of flonase. He is considering seeing an ENT b/c he feels like he needs sinus surgery.  He'll call here for referral order if the ENT office tells him he needs one."  INTERIM HX: Doing well Has weened himself slowly down to 5mg  of paxil qd---is interested in changing over to wellbutrin b/c his girlfriend has had much success with this med and says it has not made her feel emotional blunting like the SSRI's did.  He has noted the emotional blunting go away on 5mg  paxil w/out signif rebound in depression or anxiety.  Pt states all is going well with the med at current dosing: much improved focus, concentration, task completion.  Less frustration, better multitasking, less impulsivity and restlessness.  Mood is stable. No side effects from the medication.  Also, has had 10d of nasal cong/sinus pressure, thick nasal mucous, was having productive cough but this is getting better last couple days.  No fevers.  No ST.  +general face/forehead aching. Home covid test neg.  PMP AWARE reviewed today: most recent rx for both short and long acting dexedrine was filled 06/08/21, # 60 of each, rx by me. No red flags.  ROS as above, plus--> no CP, no SOB, no wheezing, no dizziness, no rashes, no melena/hematochezia.  No polyuria or polydipsia.  No myalgias or  arthralgias.  No focal weakness, paresthesias, or tremors.  No acute vision or hearing abnormalities.  No dysuria or unusual/new urinary urgency or frequency.  No recent changes in lower legs. No n/v/d or abd pain.  No palpitations.    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 with [facility] plans to do surgery as of 10/01/13.   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 06/10/21 recommended colonoscopy 12/2017   History of shingles    Pneumonia due to COVID-19 virus 05/2020   Tinea versicolor    Responded completely to ketoconazole oral (2018)   Tobacco dependence    Wrist fracture, closed 05/2016   left    Past Surgical History:  Procedure Laterality Date   NO PAST SURGERIES      Outpatient Medications Prior to Visit  Medication Sig Dispense Refill   fluticasone (FLONASE) 50 MCG/ACT nasal spray Place 2 sprays into both nostrils daily. 16 g 11   dextroamphetamine (DEXEDRINE SPANSULE) 15 MG 24 hr capsule 1 cap po bid 60 capsule 0   dextroamphetamine (DEXTROSTAT) 10 MG tablet TAKE 2 TABLETS EACH AFTERNOON. 60 tablet 0   PARoxetine (PAXIL) 20 MG tablet Take  1 tablet (20 mg total) by mouth daily. 30 tablet 0   mupirocin ointment (BACTROBAN) 2 % Apply to affected area of scalp three times per day for 10d (Patient not taking: No sig reported) 15 g 0   No facility-administered medications prior to visit.    Allergies  Allergen Reactions   Nasonex [Mometasone] Other (See Comments)    Nasal irritation/nosebleeds    PE: Vitals with BMI 06/28/2021 03/08/2021 12/04/2020  Height 5\' 10"  5\' 10"  5\' 10"   Weight 165 lbs 166 lbs 172 lbs  BMI 23.68 23.82 24.68  Systolic 106 120  Diastolic 69 79 80  Pulse 80 87 91  Some encounter information is confidential and restricted. Go to Review Flowsheets  activity to see all data.    Wt Readings from Last 2 Encounters:  06/28/21 165 lb (74.8 kg)  03/08/21 166 lb (75.3 kg)   VS: noted--normal. Gen: alert, NAD, NONTOXIC APPEARING. HEENT: eyes without injection, drainage, or swelling.  Ears: EACs clear, TMs with normal light reflex and landmarks.  Nose: Clear rhinorrhea, with some dried, crusty exudate adherent to mildly injected mucosa.  No purulent d/c.  No paranasal sinus TTP.  No facial swelling.  Throat and mouth without focal lesion.  No pharyngial swelling, erythema, or exudate.   Neck: supple, no LAD.   LUNGS: CTA bilat, nonlabored resps.   CV: RRR, no m/r/g. EXT: no c/c/e SKIN: no rash LABS:  none  IMPRESSION AND PLAN:  1) ADD: stable on dexedrine spansules 15mg  bid as well as dextrostat 10mg  2 tabs q afternoon. I did electronic rx's for these meds today for each of the next 3 months.  Appropriate fill on/after date was noted on each rx.  2) Recurrent MDD, in long term remission. Emotional blunting on paxil at 10mg  or higher. OK to switch to wellbutrin xl 150mg  qd.  He'll continue the 5mg  dosing of paxil for 1 week cross-over.  3) Acute sinusitis.  Sound like he has had a bronchitic component as well.  He is a smoker. Continue neti pot. Zpack rx'd. Prednisone 40 qd x 5d.  An After Visit Summary was printed and given to the patient.  FOLLOW UP: Return in about 3 months (around 09/28/2021) for routine chronic illness f/u.  Signed:  08/28/21, MD           06/28/2021

## 2021-10-03 ENCOUNTER — Other Ambulatory Visit: Payer: Self-pay

## 2021-10-03 ENCOUNTER — Encounter: Payer: Self-pay | Admitting: Family Medicine

## 2021-10-03 ENCOUNTER — Other Ambulatory Visit: Payer: Self-pay | Admitting: Family Medicine

## 2021-10-03 ENCOUNTER — Ambulatory Visit: Payer: BC Managed Care – PPO | Admitting: Family Medicine

## 2021-10-03 VITALS — BP 107/67 | HR 76 | Temp 97.8°F | Ht 70.0 in | Wt 166.2 lb

## 2021-10-03 DIAGNOSIS — F988 Other specified behavioral and emotional disorders with onset usually occurring in childhood and adolescence: Secondary | ICD-10-CM

## 2021-10-03 DIAGNOSIS — F3342 Major depressive disorder, recurrent, in full remission: Secondary | ICD-10-CM

## 2021-10-03 MED ORDER — DEXTROAMPHETAMINE SULFATE 10 MG PO TABS: ORAL_TABLET | ORAL | 0 refills | Status: DC

## 2021-10-03 MED ORDER — DEXTROAMPHETAMINE SULFATE ER 15 MG PO CP24: ORAL_CAPSULE | ORAL | 0 refills | Status: DC

## 2021-10-03 MED ORDER — DEXTROAMPHETAMINE SULFATE 10 MG PO TABS
ORAL_TABLET | ORAL | 0 refills | Status: DC
Start: 2021-10-03 — End: 2021-10-03

## 2021-10-03 MED ORDER — BUPROPION HCL ER (XL) 150 MG PO TB24: 150.0000 mg | ORAL_TABLET | Freq: Every day | ORAL | 3 refills | Status: DC

## 2021-10-03 NOTE — Telephone Encounter (Signed)
Please resend, rx pending 

## 2021-10-03 NOTE — Telephone Encounter (Signed)
Before we make a change in medication I recommend he call around to [facility] and [facility] ( or other [facility] near [facility]) to see if they carry his current medication.

## 2021-10-03 NOTE — Telephone Encounter (Signed)
Pt advised of recommendations. He will call back with update

## 2021-10-03 NOTE — Telephone Encounter (Signed)
Pt called his pharmacy along with other [facility] no longer have the medication  dextroamphetamine dextroamphetamine (DEXEDRINE SPANSULE) 15 MG 24 hr capsule  Pt dosen't know what to do about medication pt wanting to know can he have a substitute for the medication that [facility].  [id number] [place],  - [address] Phone:  [phone]  Fax:  [phone]     Pt cell: [phone]

## 2021-10-03 NOTE — Progress Notes (Signed)
OFFICE VISIT  HPI:    Patient is a 31 y.o. male who presents for 3 mo f/u adult ADD and recurrent MDD. A/P as of last visit: "1) ADD: stable on dexedrine spansules 15mg  bid as well as dextrostat 10mg  2 tabs q afternoon. I did electronic rx's for these meds today for each of the next 3 months.  Appropriate fill on/after date was noted on each rx.   2) Recurrent MDD, in long term remission. Emotional blunting on paxil at 10mg  or higher. OK to switch to wellbutrin xl 150mg  qd.  He'll continue the 5mg  dosing of paxil for 1 week cross-over.   3) Acute sinusitis.  Sound like he has had a bronchitic component as well.  He is a smoker. Continue neti pot. Zpack rx'd. Prednisone 40 qd x 5d."  INTERIM HX: Nicholas Wu is doing well. He feels like the Wellbutrin is very effective and he has no blunting of his affect on this medication.  Incidentally, he has quit drinking alcohol since being on this medication. 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  Still working long hours with [employer].  PMP AWARE reviewed today: most recent rx for dexedrine spansule 15 and dextrostat 10 was filled 09/04/21, # 60 of each, rx by me. No red flags.  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 with [facility] plans to do surgery as of 10/01/13.   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 09/06/21 recommended colonoscopy 12/2017   History of shingles    Pneumonia due to COVID-19 virus 05/2020   Tinea versicolor    Responded completely to ketoconazole oral (2018)   Tobacco dependence    Wrist fracture, closed 05/2016   left    Past Surgical History:  Procedure Laterality Date   NO PAST SURGERIES      Outpatient Medications Prior to Visit  Medication Sig Dispense Refill   mupirocin ointment  (BACTROBAN) 2 % Apply to affected area of scalp three times per day for 10d 15 g 0   fluticasone (FLONASE) 50 MCG/ACT nasal spray Place 2 sprays into both nostrils daily. 16 g 11   azithromycin (ZITHROMAX) 250 MG tablet 2 tabs po qd x 1d, then 1 tab po qd x 4d (Patient not taking: Reported on 10/03/2021) 6 tablet 0   buPROPion (WELLBUTRIN XL) 150 MG 24 hr tablet Take 1 tablet (150 mg total) by mouth daily. 30 tablet 3   dextroamphetamine (DEXEDRINE SPANSULE) 15 MG 24 hr capsule 1 cap po bid 60 capsule 0   dextroamphetamine (DEXTROSTAT) 10 MG tablet TAKE 2 TABLETS EACH AFTERNOON. 60 tablet 0   predniSONE (DELTASONE) 20 MG tablet 2 tabs po qd x 5d (Patient not taking: Reported on 10/03/2021) 10 tablet 0   No facility-administered medications prior to visit.    Allergies  Allergen Reactions   Nasonex [Mometasone] Other (See Comments)    Nasal irritation/nosebleeds    ROS As per HPI  PE: Vitals with BMI 10/03/2021 06/28/2021 03/08/2021  Height 5\' 10"  5\' 10"  5\' 10"   Weight 166 lbs 3 oz 165 lbs 166 lbs  BMI 23.85 23.68 23.82  Systolic 107 106 12/01/2021  Diastolic 67 69 79  Pulse 76 80 87  Some encounter information is confidential and restricted. Go to  Review Flowsheets activity to see all data.     Physical Exam  Wt Readings from Last 2 Encounters:  10/03/21 166 lb 3.2 oz (75.4 kg)  06/28/21 165 lb (74.8 kg)    Gen: alert, oriented x 4, affect pleasant.  Lucid thinking and conversation noted. HEENT: PERRLA, EOMI.   Neck: no LAD, mass, or thyromegaly. CV: RRR, no m/r/g LUNGS: CTA bilat, nonlabored. NEURO: no tremor or tics noted on observation.  Coordination intact. CN 2-12 grossly intact bilaterally, strength 5/5 in all extremeties.  No ataxia.   LABS:  Last CBC Lab Results  Component Value Date   WBC 5.4 11/12/2017   HGB 14.8 11/12/2017   HCT 43.9 11/12/2017   MCV 86.3 11/12/2017   MCH 30.2 05/15/2016   RDW 13.5 11/12/2017   PLT 291.0 11/12/2017   Last metabolic  panel Lab Results  Component Value Date   GLUCOSE 88 11/12/2017   NA 138 11/12/2017   K 4.3 11/12/2017   CL 101 11/12/2017   CO2 31 11/12/2017   BUN 11 11/12/2017   CREATININE 0.93 11/12/2017   GFRNONAA >60 05/15/2016   CALCIUM 9.8 11/12/2017   PROT 7.2 11/12/2017   ALBUMIN 4.5 11/12/2017   BILITOT 0.7 11/12/2017   ALKPHOS 56 11/12/2017   AST 14 11/12/2017   ALT 14 11/12/2017   ANIONGAP 7 05/15/2016   IMPRESSION AND PLAN:  #1 adult ADD. Doing well on Dexedrine extended release 15 mg, 1 twice daily. Also on Dextrostat 10 mg tabs, 2 tabs daily as needed. Controlled substance contract and urine drug screen are up-to-date. I did electronic rx's for these meds today for each of the next 3 mo.  Appropriate fill on/after date was noted on each rx.  #2 recurrent major depressive disorder.  In remission. Tolerating Wellbutrin XL 150/day and we will continue this.  An After Visit Summary was printed and given to the patient.  FOLLOW UP: Return in about 3 months (around 01/01/2022) for routine chronic illness f/u.  Signed:  Santiago Bumpers, MD           10/03/2021

## 2021-10-03 NOTE — Telephone Encounter (Signed)
Okay, new prescription sent 

## 2021-10-03 NOTE — Telephone Encounter (Signed)
LM for pt to return call regarding medication 

## 2021-10-03 NOTE — Telephone Encounter (Signed)
Pt called with an update on where to send medication   [id number] [place], [place] - [address] AT [address] Phone:  [phone]  Fax:  [phone]

## 2021-10-03 NOTE — Telephone Encounter (Signed)
Please review and advise.

## 2021-10-04 NOTE — Telephone Encounter (Signed)
Pt was advised refill sent. States pharmacy was 4 hours behind yesterday so he will check rx status.

## 2021-11-01 ENCOUNTER — Telehealth: Payer: Self-pay

## 2021-11-01 NOTE — Telephone Encounter (Signed)
Called [facility] [place] to get all rx on [facility] transferred. Pt was advised. Pharmacy updated as well.

## 2021-11-01 NOTE — Telephone Encounter (Signed)
Patient refill request.  Patient stated that all prescriptions were to be moved to [facility] [place].  Patient stated all but one was transferred.  buPROPion (WELLBUTRIN XL) 150 MG 24 hr tablet [id number]   Please send prescription to [facility] [place], please update preferred pharmacy location and remove ALL others so that there is not a chance of future prescriptions getting sent to wrong pharmacy. Thank you.

## 2021-11-05 ENCOUNTER — Other Ambulatory Visit: Payer: Self-pay | Admitting: Family Medicine

## 2021-11-05 NOTE — Telephone Encounter (Signed)
Med refill  dextroamphetamine dextroamphetamine (DEXEDRINE SPANSULE) 15 MG 24 hr capsule   [id number] [place], [place] - [address] Phone:  [phone]  Fax:  [phone]

## 2021-11-06 ENCOUNTER — Other Ambulatory Visit: Payer: Self-pay | Admitting: Family Medicine

## 2021-11-06 MED ORDER — DEXTROAMPHETAMINE SULFATE 10 MG PO TABS: ORAL_TABLET | ORAL | 0 refills | Status: DC

## 2021-11-06 MED ORDER — DEXTROAMPHETAMINE SULFATE ER 15 MG PO CP24: ORAL_CAPSULE | ORAL | 0 refills | Status: DC

## 2021-11-06 NOTE — Telephone Encounter (Signed)
Requesting: dexedrine Contract: 06/28/21 UDS: 12/04/20 Last Visit: 10/03/21 Next Visit: advised to f/u 3 months Last Refill: 10/03/21(60,0)  Please Advise. Med pending

## 2021-11-06 NOTE — Telephone Encounter (Signed)
Pt advised refill sent. °

## 2021-11-07 ENCOUNTER — Telehealth: Payer: Self-pay

## 2021-11-07 ENCOUNTER — Other Ambulatory Visit: Payer: Self-pay | Admitting: Family Medicine

## 2021-11-07 MED ORDER — DEXTROAMPHETAMINE SULFATE ER 15 MG PO CP24: 15.0000 mg | ORAL_CAPSULE | Freq: Every day | ORAL | 0 refills | Status: DC

## 2021-11-07 NOTE — Telephone Encounter (Signed)
Pt called stating that his insurance will not cover the brand name for dexedrine 15 mg. Please change Rx to generic Rx. Rx sent 11/06/21

## 2021-11-07 NOTE — Telephone Encounter (Signed)
Spoke with patient regarding results/recommendations.  

## 2021-11-07 NOTE — Telephone Encounter (Signed)
Okay.  Prescription sent.

## 2021-11-08 MED ORDER — DEXTROAMPHETAMINE SULFATE ER 15 MG PO CP24: 15.0000 mg | ORAL_CAPSULE | Freq: Every day | ORAL | 0 refills | Status: DC

## 2021-11-08 NOTE — Telephone Encounter (Signed)
Pt advised of rx update.

## 2021-11-08 NOTE — Telephone Encounter (Signed)
Okay, prescription [facility] to [facility] 

## 2021-11-08 NOTE — Addendum Note (Signed)
Addended by: Jeoffrey Massed on: 11/08/2021 03:07 PM   Modules accepted: Orders

## 2021-11-08 NOTE — Telephone Encounter (Signed)
Pt said [facility] [place] [facility] not [facility]s. [facility] [address] [facility] [place] [facility] fill but they need new RX for generic dextroamphetamine (DEXEDRINE) 15 MG 24 hr capsule  [id number] [place], [place] - [address] Phone:  [phone]  Fax:  [phone]

## 2021-12-04 ENCOUNTER — Other Ambulatory Visit: Payer: Self-pay | Admitting: Family Medicine

## 2021-12-04 NOTE — Telephone Encounter (Signed)
Requesting: dextroamphetamine ?Contract: 06/28/21 ?UDS: 12/04/20 ?Last Visit: 10/03/21 ?Next Visit: advised to f/u April ?Last Refill: dextrostat (11/06/21) #60 dexedrine (11/08/21) #30 0 rf on both and filled at [facility] [address] ? ?Please Advise. Meds pending ?

## 2021-12-04 NOTE — Telephone Encounter (Signed)
Med refill  ? ?dextroamphetamine ?dextroamphetamine (DEXEDRINE) 15 MG 24 hr capsule ? ? ?[facility] [address] [place] - [address] Phone:  [phone]  ?Fax:  [phone]  ?  ? ? ? ?

## 2021-12-05 MED ORDER — DEXTROAMPHETAMINE SULFATE ER 15 MG PO CP24: 15.0000 mg | ORAL_CAPSULE | Freq: Every day | ORAL | 0 refills | Status: DC

## 2021-12-05 MED ORDER — DEXTROAMPHETAMINE SULFATE 10 MG PO TABS: ORAL_TABLET | ORAL | 0 refills | Status: DC

## 2021-12-06 ENCOUNTER — Encounter (HOSPITAL_BASED_OUTPATIENT_CLINIC_OR_DEPARTMENT_OTHER): Payer: Self-pay | Admitting: Emergency Medicine

## 2021-12-06 ENCOUNTER — Emergency Department (HOSPITAL_BASED_OUTPATIENT_CLINIC_OR_DEPARTMENT_OTHER)
Admission: EM | Admit: 2021-12-06 | Discharge: 2021-12-06 | Disposition: A | Discharge status: Home / Self Care | Payer: BC Managed Care – PPO | Attending: Emergency Medicine | Admitting: Emergency Medicine

## 2021-12-06 ENCOUNTER — Other Ambulatory Visit: Payer: Self-pay

## 2021-12-06 DIAGNOSIS — S6992XA Unspecified injury of left wrist, hand and finger(s), initial encounter: Secondary | ICD-10-CM | POA: Diagnosis present

## 2021-12-06 DIAGNOSIS — S61512A Laceration without foreign body of left wrist, initial encounter: Secondary | ICD-10-CM | POA: Insufficient documentation

## 2021-12-06 DIAGNOSIS — W260XXA Contact with knife, initial encounter: Secondary | ICD-10-CM | POA: Insufficient documentation

## 2021-12-06 MED ORDER — LIDOCAINE-EPINEPHRINE (PF) 2 %-1:200000 IJ SOLN
10.0000 mL | Freq: Once | INTRAMUSCULAR | Status: AC
  Administered 2021-12-06: 10 mL
  Filled 2021-12-06: qty 20

## 2021-12-06 NOTE — ED Triage Notes (Signed)
Pt presents for laceration to L forearm which he obtained from "dull pocketknife". Arrives with tight bandage on arm. L hand is colder than R hand, L radial 1+, cap refill>3 sec; distal circulation intact. Bleeding controlled at arrival.  ?

## 2021-12-06 NOTE — ED Notes (Addendum)
Dressing removed, wound cleansed (approx 1.5cm laceration to L anterior distal forearm), dressing replaced with gauze and coban such that cap refill improved to <3 sec. Sensation and motor intact L hand  ?

## 2021-12-06 NOTE — ED Provider Notes (Signed)
?[facility] ?Provider Note ? ? ?CSN: [id number] ?Arrival date & time: 12/06/21  0135 ? ?  ? ?History ? ?Chief Complaint  ?Patient presents with  ? Extremity Laceration  ? ? ?Nicholas Wu is a 31 y.o. male. ? ?Patient presents to the emergency department for evaluation of laceration to left wrist.  Patient reports that he was trying to open a box with all pocket knife and it slipped, accidentally stabbed himself in the left wrist. ? ? ?  ? ?Home Medications ?Prior to Admission medications   ?Medication Sig Start Date End Date Taking? Authorizing Provider  ?buPROPion (WELLBUTRIN XL) 150 MG 24 hr tablet Take 1 tablet (150 mg total) by mouth daily. 10/03/21   McGowen, Adrian Blackwater, MD  ?dextroamphetamine (DEXEDRINE) 15 MG 24 hr capsule Take 1 capsule (15 mg total) by mouth daily. 12/05/21   McGowen, Adrian Blackwater, MD  ?dextroamphetamine (DEXTROSTAT) 10 MG tablet TAKE 2 TABLETS EACH AFTERNOON. 12/05/21   McGowen, Adrian Blackwater, MD  ?fluticasone (FLONASE) 50 MCG/ACT nasal spray Place 2 sprays into both nostrils daily. 03/08/21   McGowen, Adrian Blackwater, MD  ?mupirocin ointment (BACTROBAN) 2 % Apply to affected area of scalp three times per day for 10d 12/04/20   McGowen, Adrian Blackwater, MD  ?   ? ?Allergies    ?Nasonex [mometasone]   ? ?Review of Systems   ?Review of Systems  ?Skin:  Positive for wound.  ? ?Physical Exam ?Updated Vital Signs ?BP 134/76   Pulse 97   Temp 98 ?F (36.7 ?C) (Oral)   Resp 18   Wt 74.8 kg   SpO2 100%   BMI 23.68 kg/m?  ?Physical Exam ?Vitals and nursing note reviewed.  ?Constitutional:   ?   Appearance: Normal appearance.  ?HENT:  ?   Head: Atraumatic.  ?Musculoskeletal:     ?   General: Normal range of motion.  ?   Left hand: No deformity. Normal range of motion. Normal strength. Normal sensation. There is no disruption of two-point discrimination.  ?   Comments: Normal grip strength, normal flexion of all fingers.  Normal abduction and abduction of fingers.  ?Skin: ?   Findings:  Laceration (Left wrist, 1.25 cm) present.  ?Neurological:  ?   Mental Status: He is alert.  ?   Sensory: Sensation is intact.  ?   Motor: Motor function is intact.  ?   Comments: Normal motor function across all 3 nerves in the hand.  Normal sensation across all 3 nerves in the hand.  No deficit.  ? ? ?ED Results / Procedures / Treatments   ?Labs ?(all labs ordered are listed, but only abnormal results are displayed) ?Labs Reviewed - No data to display ? ?EKG ?None ? ?Radiology ?No results found. ? ?Procedures ?Marland Kitchen.Laceration Repair ? ?Date/Time: 12/06/2021 3:10 AM ?Performed by: Orpah Greek, MD ?Authorized by: Orpah Greek, MD  ? ?Consent:  ?  Consent obtained:  Verbal ?  Consent given by:  Patient ?  Risks, benefits, and alternatives were discussed: yes   ?  Risks discussed:  Infection, pain and vascular damage ?Universal protocol:  ?  Procedure explained and questions answered to patient or proxy's satisfaction: yes   ?  Site/side marked: yes   ?  Immediately prior to procedure, a time out was called: yes   ?  Patient identity confirmed:  Verbally with patient ?Anesthesia:  ?  Anesthesia method:  Local infiltration ?  Local anesthetic:  Lidocaine 2% WITH epi ?  Laceration details:  ?  Location:  Hand ?  Hand location:  L wrist ?  Length (cm):  1 ?Pre-procedure details:  ?  Preparation:  Patient was prepped and draped in usual sterile fashion ?Exploration:  ?  Hemostasis achieved with:  Cautery, epinephrine and tied off vessels ?  Wound exploration: wound explored through full range of motion and entire depth of wound visualized   ?  Wound extent: no vascular damage noted   ?  Contaminated: no   ?Treatment:  ?  Area cleansed with:  Chlorhexidine ?  Amount of cleaning:  Standard ?  Irrigation method:  Pressure wash ?  Visualized foreign bodies/material removed: no   ?  Debridement:  None ?  Undermining:  None ?  Layers/structures repaired:  Deep subcutaneous ?Deep subcutaneous:  ?  Suture size:   5-0 ?  Suture material:  Vicryl ?  Suture technique:  Figure eight ?  Number of sutures:  2 ?Skin repair:  ?  Repair method:  Sutures ?  Suture size:  3-0 ?  Suture material:  Prolene ?  Suture technique:  Simple interrupted ?  Number of sutures:  2 ?Approximation:  ?  Approximation:  Close ?Repair type:  ?  Repair type:  Intermediate ?Post-procedure details:  ?  Procedure completion:  Tolerated well, no immediate complications ?Comments:  ?   There was no bleeding when I undressed the wound.  When I irrigated it, however, there became a constant oozing of blood.  I extensively explored the wound and was able to identify bleeding from the subcutaneous tissues, no pulsatile arterial blood to indicate ulnar nerve injury.  With a combination of cautery and figure-of-eight sutures, complete hemostasis was achieved.  ? ? ?Medications Ordered in ED ?Medications  ?lidocaine-EPINEPHrine (XYLOCAINE W/EPI) 2 %-1:200000 (PF) injection 10 mL (has no administration in time range)  ? ? ?ED Course/ Medical Decision Making/ A&P ?  ?                        ?Medical Decision Making ?Risk ?Prescription drug management. ? ? ?Patient with accidental laceration to left wrist.  He has normal neurovascular function in the hand.  I did identify bleeding in the subcutaneous tissues of the medial aspect of the wound but no arterial injury.  Wound repaired with sutures.  Suture removal in 10 days. ? ? ? ? ? ? ? ?Final Clinical Impression(s) / ED Diagnoses ?Final diagnoses:  ?Wrist laceration, left, initial encounter  ? ? ?Rx / DC Orders ?ED Discharge Orders   ? ? None  ? ?  ? ? ?  ?Orpah Greek, MD ?12/06/21 0319 ? ?

## 2021-12-18 ENCOUNTER — Encounter: Payer: Self-pay | Admitting: Family Medicine

## 2021-12-18 ENCOUNTER — Ambulatory Visit (INDEPENDENT_AMBULATORY_CARE_PROVIDER_SITE_OTHER): Payer: BC Managed Care – PPO | Admitting: Family Medicine

## 2021-12-18 VITALS — BP 116/70 | HR 76 | Temp 98.2°F | Ht 70.0 in | Wt 170.8 lb

## 2021-12-18 DIAGNOSIS — F988 Other specified behavioral and emotional disorders with onset usually occurring in childhood and adolescence: Secondary | ICD-10-CM | POA: Diagnosis not present

## 2021-12-18 DIAGNOSIS — Z4802 Encounter for removal of sutures: Secondary | ICD-10-CM | POA: Diagnosis not present

## 2021-12-18 MED ORDER — DEXTROAMPHETAMINE SULFATE 10 MG PO TABS: ORAL_TABLET | ORAL | 0 refills | Status: DC

## 2021-12-18 MED ORDER — LISDEXAMFETAMINE DIMESYLATE 70 MG PO CAPS: 70.0000 mg | ORAL_CAPSULE | Freq: Every day | ORAL | 0 refills | Status: DC

## 2021-12-18 NOTE — Progress Notes (Signed)
OFFICE VISIT ? ?12/18/2021 ? ?CC:  ?Chief Complaint  ?Patient presents with  ? Stitch Remova  ?  Pt presents for stitch removal, placed 14 days ago.  ? ? ?Patient is a 31 y.o. male who presents for suture removal and ADD med issues. ? ?HPI: ?Sutures placed at ED visit 12/06/21, L wrist accidental laceration with knife when trying to open/cut a box. ?Feels no pain, says it is healed well. ? ?Has had issues getting his Dexedrine extended release tabs that he has been on for years.  There is a supply issue with this medication. ?Patient states he had taken a friend's Vyvanse in the remote past and it helped the same way as his extended release Dexedrine did, 70 mg dose.  He says his insurer covers this. ? ? ?Past Medical History:  ?Diagnosis Date  ? ADHD (attention deficit hyperactivity disorder)   ? Anxiety and depression   ? 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 Dr. Milly Jakob with [facility] plans to do surgery as of 10/01/13.  ? 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 with associated hives  ? Hematochezia   ? Dr. Fuller Plan recommended colonoscopy 12/2017  ? History of shingles   ? Pneumonia due to COVID-19 virus 05/2020  ? Tinea versicolor   ? Responded completely to ketoconazole oral (2018)  ? Tobacco dependence   ? Wrist fracture, closed 05/2016  ? left  ? ? ?Past Surgical History:  ?Procedure Laterality Date  ? NO PAST SURGERIES    ? ? ?Outpatient Medications Prior to Visit  ?Medication Sig Dispense Refill  ? buPROPion (WELLBUTRIN XL) 150 MG 24 hr tablet Take 1 tablet (150 mg total) by mouth daily. 90 tablet 3  ? fluticasone (FLONASE) 50 MCG/ACT nasal spray Place 2 sprays into both nostrils daily. 16 g 11  ? mupirocin ointment (BACTROBAN) 2 % Apply to affected area of scalp three times per day for 10d 15 g 0  ? dextroamphetamine (DEXEDRINE) 15 MG 24 hr capsule Take 1 capsule (15 mg total) by mouth daily. 30 capsule 0  ? dextroamphetamine (DEXTROSTAT) 10 MG tablet TAKE 2 TABLETS EACH AFTERNOON.  60 tablet 0  ? ?No facility-administered medications prior to visit.  ? ? ?Allergies  ?Allergen Reactions  ? Nasonex [Mometasone] Other (See Comments)  ?  Nasal irritation/nosebleeds  ? ? ?ROS ?As per HPI ? ?PE: ? ?  12/18/2021  ? 10:14 AM 12/06/2021  ?  3:15 AM 12/06/2021  ?  2:30 AM  ?Vitals with BMI  ?Height 5\' 10"     ?Weight 170 lbs 13 oz    ?BMI 24.51    ?Systolic [id number]  ?Diastolic 70 76 76  ?Pulse 76 89 97  ? ?Physical Exam ?General: Alert and well-appearing ?Affect is pleasant, thought and speech are lucid ?Left wrist with: Small healed laceration on volar surface, 2 sutures in place--removed without problem today.  Radial and ulnar pulses 2+.  Flexion and extension at the wrist and fingers is normal. ? ? ?LABS:  ?None today  ? ?IMPRESSION AND PLAN: ? ?#1 left wrist laceration.  Removed the 2 sutures today without problem. ?Great healing, no signs of infection. ? ?2.  ADD.  Dexedrine medication shortage. ?Changed to Vyvanse 70 mg a day, #30, prescription sent today. ?Continue Dextrostat 10 mg, 1-2 at the end of the day as needed. ? ?An After Visit Summary was printed and given to the patient. ? ?FOLLOW UP: Return in about 3 months (  around 03/20/2022) for Follow-up ADD. ? ?Signed:  Crissie Sickles, MD           12/18/2021 ? ?

## 2022-01-17 ENCOUNTER — Other Ambulatory Visit: Payer: Self-pay

## 2022-01-17 ENCOUNTER — Other Ambulatory Visit: Payer: Self-pay | Admitting: Family Medicine

## 2022-01-17 MED ORDER — LISDEXAMFETAMINE DIMESYLATE 70 MG PO CAPS
70.0000 mg | ORAL_CAPSULE | Freq: Every day | ORAL | 0 refills | Status: DC
Start: 2022-01-17 — End: 2022-01-17

## 2022-01-17 MED ORDER — LISDEXAMFETAMINE DIMESYLATE 70 MG PO CAPS
70.0000 mg | ORAL_CAPSULE | Freq: Every day | ORAL | 0 refills | Status: DC
Start: 2022-01-17 — End: 2022-03-15

## 2022-01-17 NOTE — Telephone Encounter (Signed)
Please review and advise on message below

## 2022-01-17 NOTE — Telephone Encounter (Signed)
Pt called stating that he was supposed to get refills on medications sent to the pharmacy for a few months and he only had a 30 d/s. Confirmed with pharmacy that pt did not have any refills on file and last picked up on 12/18/21. ? ?RF request for vyvanse 70 ?LOV: 12/18/21 ?Next ov: n/a ?Last written: 12/18/21 ? ?

## 2022-02-26 ENCOUNTER — Other Ambulatory Visit: Payer: Self-pay

## 2022-02-26 NOTE — Telephone Encounter (Signed)
Patient refill request.  [facility] [place]  dextroamphetamine (DEXTROSTAT) 10 MG tablet [id number]

## 2022-02-27 MED ORDER — DEXTROAMPHETAMINE SULFATE 10 MG PO TABS: ORAL_TABLET | ORAL | 0 refills | Status: DC

## 2022-02-27 NOTE — Telephone Encounter (Signed)
Requesting: dextrostat Contract: 06/28/21 UDS: 12/04/20 Last Visit: 12/18/21 Next Visit: advised to f/u June Last Refill: 12/18/21(60,0)  Please Advise. Med pending

## 2022-02-28 ENCOUNTER — Other Ambulatory Visit: Payer: Self-pay

## 2022-02-28 MED ORDER — DEXTROAMPHETAMINE SULFATE 10 MG PO TABS: ORAL_TABLET | ORAL | 0 refills | Status: DC

## 2022-02-28 NOTE — Telephone Encounter (Signed)
Please advise, Pt states that Rx is not in stock at current location. [facility] [address] [facility]   dextroamphetamine (DEXTROSTAT) 10 MG tablet

## 2022-02-28 NOTE — Telephone Encounter (Signed)
Last rx completed 6/7 and sent to [facility] [place].  Med pending for different location

## 2022-02-28 NOTE — Addendum Note (Signed)
Addended by: Deveron Furlong D on: 02/28/2022 05:02 PM   Modules accepted: Orders

## 2022-03-15 ENCOUNTER — Encounter: Payer: Self-pay | Admitting: Family Medicine

## 2022-03-15 ENCOUNTER — Ambulatory Visit: Payer: BC Managed Care – PPO | Admitting: Family Medicine

## 2022-03-15 VITALS — BP 134/84 | HR 98 | Temp 98.7°F | Ht 70.0 in | Wt 170.0 lb

## 2022-03-15 DIAGNOSIS — F3342 Major depressive disorder, recurrent, in full remission: Secondary | ICD-10-CM

## 2022-03-15 DIAGNOSIS — Z79899 Other long term (current) drug therapy: Secondary | ICD-10-CM | POA: Diagnosis not present

## 2022-03-15 DIAGNOSIS — F988 Other specified behavioral and emotional disorders with onset usually occurring in childhood and adolescence: Secondary | ICD-10-CM | POA: Diagnosis not present

## 2022-03-15 MED ORDER — LISDEXAMFETAMINE DIMESYLATE 70 MG PO CAPS: 70.0000 mg | ORAL_CAPSULE | Freq: Every day | ORAL | 0 refills | Status: DC

## 2022-03-15 MED ORDER — AMPHETAMINE-DEXTROAMPHETAMINE 10 MG PO TABS: 10.0000 mg | ORAL_TABLET | Freq: Two times a day (BID) | ORAL | 0 refills | Status: DC

## 2022-04-16 ENCOUNTER — Telehealth: Payer: Self-pay

## 2022-04-16 ENCOUNTER — Other Ambulatory Visit: Payer: Self-pay | Admitting: Family Medicine

## 2022-04-16 NOTE — Telephone Encounter (Signed)
Patient refill request.  [facility].  Patient states that pharmacy is out of 70mg  capsules, patient is requesting to change to 50mg  capsule.  They do have 50mg  in stock.  lisdexamfetamine (VYVANSE) 70 MG capsule 

## 2022-04-16 NOTE — Telephone Encounter (Signed)
Requesting: Vyvanse 50mg  Contract: n/a for this med UDS: n/a for this med Last Visit: 03/15/22 Next Visit: advised to f/u 3 months Last Refill: 03/15/22(30,0)   Please review and advise.

## 2022-04-17 MED ORDER — LISDEXAMFETAMINE DIMESYLATE 50 MG PO CAPS: 50.0000 mg | ORAL_CAPSULE | Freq: Every day | ORAL | 0 refills | Status: DC

## 2022-04-17 NOTE — Telephone Encounter (Signed)
Requesting: adderall 10mg  bid Contract: 06/28/21 UDS: 12/04/20 Last Visit: 03/15/22 Next Visit: advised to f/u 3 months Last Refill: 03/15/22(60,0)  Please Advise. Medication pending

## 2022-04-17 NOTE — Telephone Encounter (Signed)
Pt advised refill sent for 50mg .

## 2022-04-17 NOTE — Telephone Encounter (Signed)
Okay, and prescription for Vyvanse 50 mg sent to [facility]. I took the 70 mg Vyvanse off his med list in order to avoid confusion

## 2022-04-17 NOTE — Telephone Encounter (Signed)
Patient refill request -- [facility]  amphetamine-dextroamphetamine (ADDERALL) 10 MG tablet [[id number]]

## 2022-05-16 ENCOUNTER — Other Ambulatory Visit: Payer: Self-pay

## 2022-05-16 MED ORDER — LISDEXAMFETAMINE DIMESYLATE 50 MG PO CAPS: 50.0000 mg | ORAL_CAPSULE | Freq: Every day | ORAL | 0 refills | Status: DC

## 2022-05-16 MED ORDER — AMPHETAMINE-DEXTROAMPHETAMINE 10 MG PO TABS: 10.0000 mg | ORAL_TABLET | Freq: Two times a day (BID) | ORAL | 0 refills | Status: DC

## 2022-05-16 NOTE — Telephone Encounter (Signed)
Requesting: adderall Contract: 06/28/21 UDS: 3/14/2 Last Visit: 03/15/22 Next Visit: 3 month f/u advised Last Refill: 04/17/22(60,0)  Requesting: vyvanse Contract: 06/28/21 UDS: 12/04/20 Last Visit: 03/15/22 Next Visit: 3 month f/u advised Last Refill: 04/17/22 (30,0)  Please Advise. Medications pending

## 2022-05-16 NOTE — Telephone Encounter (Signed)
Patient refill request.  [facility]  amphetamine-dextroamphetamine (ADDERALL) 10 MG tablet [[id number]]   lisdexamfetamine (VYVANSE) 50 MG capsule [[id number]]

## 2022-05-17 ENCOUNTER — Other Ambulatory Visit: Payer: Self-pay | Admitting: Family Medicine

## 2022-06-12 ENCOUNTER — Encounter: Payer: Self-pay | Admitting: Family Medicine

## 2022-06-12 ENCOUNTER — Ambulatory Visit (INDEPENDENT_AMBULATORY_CARE_PROVIDER_SITE_OTHER): Payer: BC Managed Care – PPO | Admitting: Family Medicine

## 2022-06-12 VITALS — BP 136/77 | HR 90 | Temp 98.2°F | Ht 70.0 in | Wt 164.8 lb

## 2022-06-12 DIAGNOSIS — F411 Generalized anxiety disorder: Secondary | ICD-10-CM | POA: Diagnosis not present

## 2022-06-12 DIAGNOSIS — Z79899 Other long term (current) drug therapy: Secondary | ICD-10-CM

## 2022-06-12 DIAGNOSIS — F410 Panic disorder [episodic paroxysmal anxiety] without agoraphobia: Secondary | ICD-10-CM | POA: Diagnosis not present

## 2022-06-12 DIAGNOSIS — F988 Other specified behavioral and emotional disorders with onset usually occurring in childhood and adolescence: Secondary | ICD-10-CM

## 2022-06-12 DIAGNOSIS — F3342 Major depressive disorder, recurrent, in full remission: Secondary | ICD-10-CM

## 2022-06-12 MED ORDER — BUPROPION HCL ER (XL) 150 MG PO TB24: 150.0000 mg | ORAL_TABLET | Freq: Every day | ORAL | 1 refills | Status: DC

## 2022-06-12 MED ORDER — AMPHETAMINE-DEXTROAMPHETAMINE 10 MG PO TABS: 10.0000 mg | ORAL_TABLET | Freq: Two times a day (BID) | ORAL | 0 refills | Status: DC

## 2022-06-12 MED ORDER — PAROXETINE HCL ER 12.5 MG PO TB24: 12.5000 mg | ORAL_TABLET | Freq: Every day | ORAL | 1 refills | Status: DC

## 2022-06-12 MED ORDER — LISDEXAMFETAMINE DIMESYLATE 50 MG PO CAPS: 50.0000 mg | ORAL_CAPSULE | Freq: Every day | ORAL | 0 refills | Status: DC

## 2022-06-12 NOTE — Progress Notes (Signed)
OFFICE VISIT  06/12/2022  CC: f/u add Patient is a 31 y.o. male who presents for 45-month follow-up adult ADD. A/P as of last visit: "#1 adult ADD. Doing well on Vyvanse 70 mg every morning.  He has been on Dextrostat short acting twice daily long-term as augmentation and this seems to have stopped helping at all since getting on Vyvanse. I think it is okay to switch over to short acting Adderall 10 mg twice daily as needed.  Stop Dextrostat. Continue Vyvanse 70 mg a day. Update UDS next f/u visit 3 mo.   #2 recurrent major depressive disorder. This has been well controlled with Wellbutrin XL 150 mg a day This is had the added benefit of helping him quit smoking.  He never abused alcohol in the past but has also quit drinking alcohol since getting on this medication."  INTERIM HX: Says his generalized anxiety has actually been up the last few months, even to the point of having a panic attack recently. The more he is anxious the more his mood dips. He is not drinking or smoking. He directly correlates the increase in anxiety with when he got off of his Paxil and switch to Wellbutrin. He wanted to get off the Paxil because he felt groggy the first few hours after taking it each day.   PMP AWARE reviewed today: most recent rx's for Vyvanse 50 mg and Adderall 10 mg were filled 05/17/2022, #30 and 60 respectively, rx's by me. No red flags.   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 Milly Jakob with [facility] plans to do surgery as of 10/01/13.   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 Fuller Plan recommended colonoscopy 12/2017   History of shingles    Pneumonia due to COVID-19 virus 05/2020   Tinea versicolor    Responded completely to ketoconazole oral (2018)   Tobacco dependence    Wrist fracture,  closed 05/2016   left    Past Surgical History:  Procedure Laterality Date   NO PAST SURGERIES      Outpatient Medications Prior to Visit  Medication Sig Dispense Refill   fluticasone (FLONASE) 50 MCG/ACT nasal spray Place 2 sprays into both nostrils daily. 16 g 11   amphetamine-dextroamphetamine (ADDERALL) 10 MG tablet Take 1 tablet (10 mg total) by mouth 2 (two) times daily. 60 tablet 0   buPROPion (WELLBUTRIN XL) 150 MG 24 hr tablet Take 1 tablet (150 mg total) by mouth daily. 30 tablet 0   lisdexamfetamine (VYVANSE) 50 MG capsule Take 1 capsule (50 mg total) by mouth daily. 30 capsule 0   No facility-administered medications prior to visit.    Allergies  Allergen Reactions   Nasonex [Mometasone] Other (See Comments)    Nasal irritation/nosebleeds    ROS As per HPI  PE:    06/12/2022    3:08 PM 03/15/2022    3:05 PM 12/18/2021   10:14 AM  Vitals with BMI  Height 5\' 10"  5\' 10"  5\' 10"   Weight 164 lbs 13 oz 170 lbs 170 lbs 13 oz  BMI 23.65 27.03 50.09  Systolic 381 829 937  Diastolic 77 84 70  Pulse 90 98 76   Physical Exam  Wt Readings from Last 2 Encounters:  06/12/22 164 lb 12.8 oz (74.8 kg)  03/15/22  170 lb (77.1 kg)    Gen: alert, oriented x 4, affect pleasant.  Lucid thinking and conversation noted. HEENT: PERRLA, EOMI.   Neck: no LAD, mass, or thyromegaly. CV: RRR, no m/r/g LUNGS: CTA bilat, nonlabored. NEURO: no tremor or tics noted on observation.  Coordination intact. CN 2-12 grossly intact bilaterally, strength 5/5 in all extremeties.  No ataxia.   LABS:  Last CBC Lab Results  Component Value Date   WBC 5.4 11/12/2017   HGB 14.8 11/12/2017   HCT 43.9 11/12/2017   MCV 86.3 11/12/2017   MCH 30.2 05/15/2016   RDW 13.5 11/12/2017   PLT 291.0 [id number]   Last metabolic panel Lab Results  Component Value Date   GLUCOSE 88 11/12/2017   NA 138 11/12/2017   K 4.3 11/12/2017   CL 101 11/12/2017   CO2 31 11/12/2017   BUN 11 11/12/2017    CREATININE 0.93 11/12/2017   GFRNONAA >60 05/15/2016   CALCIUM 9.8 11/12/2017   PROT 7.2 11/12/2017   ALBUMIN 4.5 11/12/2017   BILITOT 0.7 11/12/2017   ALKPHOS 56 11/12/2017   AST 14 11/12/2017   ALT 14 11/12/2017   ANIONGAP 7 05/15/2016   IMPRESSION AND PLAN:  #1 adult ADD. Doing well on Vyvanse 50 mg a day and Adderall immediate release 10 mg morning and 10 mg afternoon. Controlled substance contract updated today. Urine drug screen today.  2.  Recurrent major depressive disorder and generalized anxiety disorder. His anxiety is significantly worse since being off of Paxil.  He has received significant benefit from being on Wellbutrin--mostly in regard to more stable mood and he has been able to quit completely quit smoking and drinking alcohol. We will try getting him back on Paxil with the controlled release formulation to try to avoid any immediate sedative effect.  0.5 mg CR tabs prescribed today, #90, RF x 1. We discussed that he can continue on the Wellbutrin XL as he is currently taking it (150mg  qd) but if he wants to slowly wean off of it this is fine as well.  An After Visit Summary was printed and given to the patient.  FOLLOW UP: Return in about 3 months (around 09/11/2022) for routine chronic illness f/u.  Signed:  Crissie Sickles, MD           06/12/2022

## 2022-06-15 LAB — DRUG MONITORING PANEL 376104, URINE
Amphetamine: 552 ng/mL — ABNORMAL HIGH (ref <250)
Amphetamines: POSITIVE ng/mL — AB (ref <500)
Barbiturates: NEGATIVE ng/mL (ref <300)
Benzodiazepines: NEGATIVE ng/mL (ref <100)
Cocaine Metabolite: NEGATIVE ng/mL (ref <150)
Desmethyltramadol: NEGATIVE ng/mL (ref <100)
Methamphetamine: NEGATIVE ng/mL (ref <250)
Opiates: NEGATIVE ng/mL (ref <100)
Oxycodone: NEGATIVE ng/mL (ref <100)
Tramadol: NEGATIVE ng/mL (ref <100)

## 2022-06-15 LAB — DM TEMPLATE

## 2022-07-12 ENCOUNTER — Other Ambulatory Visit: Payer: Self-pay

## 2022-07-12 MED ORDER — LISDEXAMFETAMINE DIMESYLATE 70 MG PO CAPS: 70.0000 mg | ORAL_CAPSULE | Freq: Every day | ORAL | 0 refills | Status: DC

## 2022-07-12 MED ORDER — AMPHETAMINE-DEXTROAMPHETAMINE 10 MG PO TABS: 10.0000 mg | ORAL_TABLET | Freq: Two times a day (BID) | ORAL | 0 refills | Status: DC

## 2022-07-12 NOTE — Telephone Encounter (Signed)
Patient refill request.  [place]  Patient had to go to a lower dose on Vyvanse (50mg ) because pharmacy did no have 70mg  in stock for the last few months. Pharmacy has 70mg  in stock now and patient is requesting to start back on correct dosage.  lisdexamfetamine (VYVANSE) 70 MG capsule   amphetamine-dextroamphetamine (ADDERALL) 10 MG tablet

## 2022-07-12 NOTE — Telephone Encounter (Signed)
Pt advised refill sent. °

## 2022-07-12 NOTE — Telephone Encounter (Signed)
Requesting: vyvanse 70mg  Contract: 06/12/22 UDS: 06/12/22 Last Visit: 06/12/22 Next Visit: 09/11/22 Last Refill: 06/12/22 (30,0) 50MG   Requesting: adderall 10mg   Contract: 06/12/22 UDS: 06/12/22 Last Visit: 06/12/22 Next Visit: 09/11/22 Last Refill: 06/12/22 (60,0)  Please Advise. Med pending

## 2022-08-01 ENCOUNTER — Ambulatory Visit: Payer: BC Managed Care – PPO

## 2022-08-13 ENCOUNTER — Other Ambulatory Visit: Payer: Self-pay

## 2022-08-13 MED ORDER — AMPHETAMINE-DEXTROAMPHETAMINE 10 MG PO TABS
10.0000 mg | ORAL_TABLET | Freq: Two times a day (BID) | ORAL | 0 refills | Status: DC
Start: 2022-08-13 — End: 2022-09-11

## 2022-08-13 MED ORDER — LISDEXAMFETAMINE DIMESYLATE 70 MG PO CAPS
70.0000 mg | ORAL_CAPSULE | Freq: Every day | ORAL | 0 refills | Status: DC
Start: 2022-08-13 — End: 2022-09-11

## 2022-08-13 NOTE — Telephone Encounter (Signed)
Patient refill request.  [facility]  amphetamine-dextroamphetamine (ADDERALL) 10 MG tablet [[id number]]  lisdexamfetamine (VYVANSE) 70 MG capsule [[id number]]

## 2022-08-13 NOTE — Telephone Encounter (Signed)
Pt advised refills sent. °

## 2022-08-13 NOTE — Telephone Encounter (Signed)
Requesting: Adderall 10mg  Contract: 06/12/22 UDS: 06/12/22 Last Visit: 06/12/22 Next Visit: 09/11/22 Last Refill: 07/12/22(60,0)  Requesting: Vyvanse 70mg  Contract: 06/12/22 UDS: 06/12/22 Last Visit: 06/12/22 Next Visit: 09/11/22 Last Refill: 07/12/22(30,0)  Please Advise. Meds pending

## 2022-09-11 ENCOUNTER — Other Ambulatory Visit: Payer: Self-pay

## 2022-09-11 ENCOUNTER — Encounter: Payer: Self-pay | Admitting: Family Medicine

## 2022-09-11 ENCOUNTER — Ambulatory Visit: Payer: BC Managed Care – PPO | Admitting: Family Medicine

## 2022-09-11 VITALS — BP 119/77 | HR 95 | Temp 97.8°F | Ht 70.0 in | Wt 171.4 lb

## 2022-09-11 DIAGNOSIS — F411 Generalized anxiety disorder: Secondary | ICD-10-CM

## 2022-09-11 DIAGNOSIS — F988 Other specified behavioral and emotional disorders with onset usually occurring in childhood and adolescence: Secondary | ICD-10-CM | POA: Diagnosis not present

## 2022-09-11 MED ORDER — LISDEXAMFETAMINE DIMESYLATE 70 MG PO CAPS
70.0000 mg | ORAL_CAPSULE | Freq: Every day | ORAL | 0 refills | Status: DC
Start: 2022-09-11 — End: 2022-10-10

## 2022-09-11 MED ORDER — LISDEXAMFETAMINE DIMESYLATE 70 MG PO CAPS: 70.0000 mg | ORAL_CAPSULE | Freq: Every day | ORAL | 0 refills | Status: DC

## 2022-09-11 MED ORDER — AMPHETAMINE-DEXTROAMPHETAMINE 10 MG PO TABS
10.0000 mg | ORAL_TABLET | Freq: Two times a day (BID) | ORAL | 0 refills | Status: DC
Start: 2022-09-11 — End: 2022-10-10

## 2022-09-11 MED ORDER — AMPHETAMINE-DEXTROAMPHETAMINE 10 MG PO TABS: 10.0000 mg | ORAL_TABLET | Freq: Two times a day (BID) | ORAL | 0 refills | Status: DC

## 2022-09-11 NOTE — Telephone Encounter (Signed)
Pt advised refill sent. °

## 2022-09-11 NOTE — Progress Notes (Signed)
OFFICE VISIT  09/11/2022  CC:  Chief Complaint  Patient presents with   Medical Management of Chronic Issues    Patient is a 31 y.o. male who presents for 21-month follow-up adult ADD and recurrent major depression disorder. A/P as of last visit: "#1 adult ADD. Doing well on Vyvanse 50 mg a day and Adderall immediate release 10 mg morning and 10 mg afternoon. Controlled substance contract updated today. Urine drug screen today.   2.  Recurrent major depressive disorder and generalized anxiety disorder. His anxiety is significantly worse since being off of Paxil.  He has received significant benefit from being on Wellbutrin--mostly in regard to more stable mood and he has been able to quit completely quit smoking and drinking alcohol. We will try getting him back on Paxil with the controlled release formulation to try to avoid any immediate sedative effect.  0.5 mg CR tabs prescribed today, #90, RF x 1. We discussed that he can continue on the Wellbutrin XL as he is currently taking it (150mg  qd) but if he wants to slowly wean off of it this is fine as well."  INTERIM HX: Says all is well. Pt states all is going well with the med at current dosing (vyvanse 70 qAM, adderall IR 10mg  bid): much improved focus, concentration, task completion.  Less frustration, better multitasking, less impulsivity and restlessness.  Mood is stable. No side effects from the medication.  Says paxil 12.5mg  SR form is great, works better and causes less somnolence. Continues on wellbutrin xl 150 qd as well.  PMP AWARE reviewed today: most recent rx for Vyvanse and Adderall were filled 08/14/2022,  #30 and #60 respectively, rx by me. No red flags.  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 08/16/2022 with [facility] plans to do surgery as of 10/01/13.    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 11/29/13 recommended colonoscopy 12/2017   History of shingles    Pneumonia due to COVID-19 virus 05/2020   Tinea versicolor    Responded completely to ketoconazole oral (2018)   Tobacco dependence    Wrist fracture, closed 05/2016   left    Past Surgical History:  Procedure Laterality Date   NO PAST SURGERIES      Outpatient Medications Prior to Visit  Medication Sig Dispense Refill   buPROPion (WELLBUTRIN XL) 150 MG 24 hr tablet Take 1 tablet (150 mg total) by mouth daily. 90 tablet 1   fluticasone (FLONASE) 50 MCG/ACT nasal spray Place 2 sprays into both nostrils daily. 16 g 11   PARoxetine (PAXIL CR) 12.5 MG 24 hr tablet Take 1 tablet (12.5 mg total) by mouth daily. 90 tablet 1   amphetamine-dextroamphetamine (ADDERALL) 10 MG tablet Take 1 tablet (10 mg total) by mouth 2 (two) times daily. 60 tablet 0   lisdexamfetamine (VYVANSE) 70 MG capsule Take 1 capsule (70 mg total) by mouth daily. Do not fill less than 30 days from last fill. 30 capsule 0   No facility-administered medications prior to visit.    Allergies  Allergen Reactions   Nasonex [Mometasone] Other (See Comments)    Nasal irritation/nosebleeds    ROS As per HPI  PE:    09/11/2022   10:23 AM 06/12/2022    3:08 PM 03/15/2022  3:05 PM  Vitals with BMI  Height 5\' 10"  5\' 10"  5\' 10"   Weight 171 lbs 6 oz 164 lbs 13 oz 170 lbs  BMI 24.59 23.65 24.39  Systolic 119 136  Diastolic 77 77 84  Pulse 95 90 98     Physical Exam  Wt Readings from Last 2 Encounters:  09/11/22 171 lb 6.4 oz (77.7 kg)  06/12/22 164 lb 12.8 oz (74.8 kg)    Gen: alert, oriented x 4, affect pleasant.  Lucid thinking and conversation noted. HEENT: PERRLA, EOMI.   Neck: no LAD, mass, or thyromegaly. CV: RRR, no m/r/g LUNGS: CTA bilat, nonlabored. NEURO: no tremor or tics noted on observation.  Coordination intact. CN 2-12 grossly intact bilaterally,  strength 5/5 in all extremeties.  No ataxia.   LABS:  none  IMPRESSION AND PLAN:  #1 adult ADD: Stable on Vyvanse 70 mg every morning and 10 mg immediate release/short acting Adderall twice a day.   Told substance contract and urine drug screen are up-to-date.  #2 recurrent major depressive disorder + GAD. Doing great on Wellbutrin XL 150 mg a day and Paxil CR 12.5 mg daily.  An After Visit Summary was printed and given to the patient.  FOLLOW UP: Return for 3-6 month f/u adhd.  Signed:  476, MD           09/11/2022

## 2022-09-11 NOTE — Telephone Encounter (Signed)
Please resend to [facility]. Rx pending

## 2022-09-18 NOTE — Progress Notes (Unsigned)
New Patient Note  RE: Nicholas Wu MRN: [id number] DOB: [birth year] Date of Office Visit: 09/19/2022  Consult requested by: Tammi Sou, MD Primary care provider: Tammi Sou, MD  Chief Complaint: No chief complaint on file.  History of Present Illness: I had the pleasure of seeing Nicholas Wu for initial evaluation at the [facility] [place] on 09/18/2022. He is a 31 y.o. male, who is referred here by McGowen, Adrian Blackwater, MD for the evaluation of ***.  ***  Assessment and Plan: Nicholas Wu is a 31 y.o. male with: No problem-specific Assessment & Plan notes found for this encounter.  No follow-ups on file.  No orders of the defined types were placed in this encounter.  Lab Orders  No laboratory test(s) ordered today    Other allergy screening: Asthma: {Blank single:19197::"yes","no"} Rhino conjunctivitis: {Blank single:19197::"yes","no"} Food allergy: {Blank single:19197::"yes","no"} Medication allergy: {Blank single:19197::"yes","no"} Hymenoptera allergy: {Blank single:19197::"yes","no"} Urticaria: {Blank single:19197::"yes","no"} Eczema:{Blank single:19197::"yes","no"} History of recurrent infections suggestive of immunodeficency: {Blank single:19197::"yes","no"}  Diagnostics: Spirometry:  Tracings reviewed. His effort: {Blank single:19197::"Good reproducible efforts.","It was hard to get consistent efforts and there is a question as to whether this reflects a maximal maneuver.","Poor effort, data can not be interpreted."} FVC: ***L FEV1: ***L, ***% predicted FEV1/FVC ratio: ***% Interpretation: {Blank single:19197::"Spirometry consistent with mild obstructive disease","Spirometry consistent with moderate obstructive disease","Spirometry consistent with severe obstructive disease","Spirometry consistent with possible restrictive disease","Spirometry consistent with mixed obstructive and restrictive disease","Spirometry uninterpretable due to  technique","Spirometry consistent with normal pattern","No overt abnormalities noted given today's efforts"}.  Please see scanned spirometry results for details.  Skin Testing: {Blank single:19197::"Select foods","Environmental allergy panel","Environmental allergy panel and select foods","Food allergy panel","None","Deferred due to recent antihistamines use"}. *** Results discussed with patient/family.   Past Medical History: Patient Active Problem List   Diagnosis Date Noted  . Substance induced mood disorder ([place])   . 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 Anxiety state 05/05/2015  . Seasonal allergic rhinitis 11/10/2014  . Rash 09/30/2013  . ADHD (attention deficit hyperactivity disorder) 09/25/2012  . Depression 08/13/2012   Past Medical History:  Diagnosis Date  . ADHD (attention deficit hyperactivity disorder)   . Anxiety and depression   . 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 Milly Jakob with [facility] plans to do surgery as of 10/01/13.  . 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 Hematochezia    Dr. Fuller Plan recommended colonoscopy 12/2017  . History of shingles   . Pneumonia due to COVID-19 virus 05/2020  . Tinea versicolor    Responded completely to ketoconazole oral (2018)  . Tobacco dependence   . Wrist fracture, closed 05/2016   left   Past Surgical History: Past Surgical History:  Procedure Laterality Date  . NO PAST SURGERIES     Medication List:  Current Outpatient Medications  Medication Sig Dispense Refill  . amphetamine-dextroamphetamine (ADDERALL) 10 MG tablet Take 1 tablet (10 mg total) by mouth 2 (two) times daily. 60 tablet 0  . buPROPion (WELLBUTRIN XL) 150 MG 24 hr tablet Take 1 tablet (150 mg total) by mouth daily. 90 tablet 1  . fluticasone (FLONASE) 50 MCG/ACT nasal spray Place 2 sprays into both nostrils daily. 16 g 11  . lisdexamfetamine (VYVANSE) 70 MG capsule Take 1 capsule (70  mg total) by mouth daily. Do not fill less than 30 days from last fill. 30 capsule 0  . PARoxetine (PAXIL CR) 12.5 MG 24 hr tablet  Take 1 tablet (12.5 mg total) by mouth daily. 90 tablet 1   No current facility-administered medications for this visit.   Allergies: Allergies  Allergen Reactions  . Nasonex [Mometasone] Other (See Comments)    Nasal irritation/nosebleeds   Social History: Social History   Socioeconomic History  . Marital status: Single    Spouse name: Not on file  . Number of children: 0  . Years of education: Not on file  . Highest education level: Not on file  Occupational History  . Occupation: Regulatory affairs officer  Tobacco Use  . Smoking status: Every Day    Packs/day: 0.50    Years: 10.00    Total pack years: 5.00    Types: Cigarettes  . Smokeless tobacco: Never  Vaping Use  . Vaping Use: Never used  Substance and Sexual Activity  . Alcohol use: Yes    Alcohol/week: 0.0 standard drinks of alcohol    Comment: 2 beers daily  . Drug use: No  . Sexual activity: Yes    Partners: Female  Other Topics Concern  . Not on file  Social History Narrative   Single, no children.   From [place].    Some college education.      Exercises occasionally.  No dietary restrictions.   Social Determinants of Health   Financial Resource Strain: Not on file  Food Insecurity: Not on file  Transportation Needs: Not on file  Physical Activity: Not on file  Stress: Not on file  Social Connections: Not on file   Lives in a ***. Smoking: *** Occupation: ***  Environmental HistoryFreight forwarder in the house: Estate agent in the family room: {Blank single:19197::"yes","no"} Carpet in the bedroom: {Blank single:19197::"yes","no"} Heating: {Blank single:19197::"electric","gas","heat pump"} Cooling: {Blank single:19197::"central","window","heat pump"} Pet: {Blank single:19197::"yes ***","no"}  Family History: Family History   Problem Relation Age of Onset  . Heart disease Maternal Grandfather   . Mental illness Maternal Grandfather   . Diabetes Maternal Grandfather   . Colon cancer Neg Hx   . Colon polyps Neg Hx   . Kidney disease Neg Hx   . Esophageal cancer Neg Hx   . Gallbladder disease Neg Hx    Problem                               Relation Asthma                                   *** Eczema                                *** Food allergy                          *** Allergic rhino conjunctivitis     ***  Review of Systems  Constitutional:  Negative for appetite change, chills, fever and unexpected weight change.  HENT:  Negative for congestion and rhinorrhea.   Eyes:  Negative for itching.  Respiratory:  Negative for cough, chest tightness, shortness of breath and wheezing.   Cardiovascular:  Negative for chest pain.  Gastrointestinal:  Negative for abdominal pain.  Genitourinary:  Negative for difficulty urinating.  Skin:  Negative for rash.  Neurological:  Negative for headaches.   Objective: There were no vitals taken  for this visit. There is no height or weight on file to calculate BMI. Physical Exam Vitals and nursing note reviewed.  Constitutional:      Appearance: Normal appearance. He is well-developed.  HENT:     Head: Normocephalic and atraumatic.     Right Ear: Tympanic membrane and external ear normal.     Left Ear: Tympanic membrane and external ear normal.     Nose: Nose normal.     Mouth/Throat:     Mouth: Mucous membranes are moist.     Pharynx: Oropharynx is clear.  Eyes:     Conjunctiva/sclera: Conjunctivae normal.  Cardiovascular:     Rate and Rhythm: Normal rate and regular rhythm.     Heart sounds: Normal heart sounds. No murmur heard.    No friction rub. No gallop.  Pulmonary:     Effort: Pulmonary effort is normal.     Breath sounds: Normal breath sounds. No wheezing, rhonchi or rales.  Musculoskeletal:     Cervical back: Neck supple.  Skin:    General:  Skin is warm.     Findings: No rash.  Neurological:     Mental Status: He is alert and oriented to person, place, and time.  Psychiatric:        Behavior: Behavior normal.  The plan was reviewed with the patient/family, and all questions/concerned were addressed.  It was my pleasure to see Nicholas Wu today and participate in his care. Please feel free to contact me with any questions or concerns.  Sincerely,  Wyline Mood, DO Allergy & Immunology  [facility] office: [phone] [facility] office: [phone]

## 2022-09-19 ENCOUNTER — Other Ambulatory Visit: Payer: Self-pay

## 2022-09-19 ENCOUNTER — Ambulatory Visit: Payer: BC Managed Care – PPO | Admitting: Allergy

## 2022-09-19 ENCOUNTER — Encounter: Payer: Self-pay | Admitting: Allergy

## 2022-09-19 VITALS — BP 116/80 | HR 102 | Temp 98.0°F | Resp 18 | Ht 72.0 in | Wt 171.0 lb

## 2022-09-19 DIAGNOSIS — J3089 Other allergic rhinitis: Secondary | ICD-10-CM | POA: Diagnosis not present

## 2022-09-19 DIAGNOSIS — L2089 Other atopic dermatitis: Secondary | ICD-10-CM | POA: Diagnosis not present

## 2022-09-19 MED ORDER — AZELASTINE-FLUTICASONE 137-50 MCG/ACT NA SUSP: 1.0000 | Freq: Two times a day (BID) | NASAL | 5 refills | Status: DC

## 2022-09-19 NOTE — Assessment & Plan Note (Signed)
See below for proper skin care. 

## 2022-09-19 NOTE — Assessment & Plan Note (Signed)
Perennial rhinoconjunctivitis symptoms for 20+ years.  Patient was on AIT as a child for 4 years with unknown benefit.  Recently saw ENT for worsening postnasal drip.  Denies reflux. Intolerant of Nasonex.  Today's skin testing showed: Positive to grass, ragweed, weed, trees, mold, dust mites, cat, dog.  Start environmental control measures as below. Use over the counter antihistamines such as Zyrtec (cetirizine), Claritin (loratadine), Allegra (fexofenadine), or Xyzal (levocetirizine) daily as needed. May take twice a day during allergy flares. May switch antihistamines every few months. Start dymista (fluticasone + azelastine nasal spray combination) 1 spray per nostril twice a day. This replaces all other nasal sprays. If it's not covered let us know.  Nasal saline spray (i.e., Simply Saline) or nasal saline lavage (i.e., NeilMed) is recommended as needed and prior to medicated nasal sprays. Consider allergy injections for long term control if above medications do not help the symptoms - handout given.  Follow up with ENT as scheduled.

## 2022-09-19 NOTE — Patient Instructions (Addendum)
Today's skin testing showed: Positive to grass, ragweed, weed, trees, mold, dust mites, cat, dog.   Results given.  Environmental allergies Start environmental control measures as below. Use over the counter antihistamines such as Zyrtec (cetirizine), Claritin (loratadine), Allegra (fexofenadine), or Xyzal (levocetirizine) daily as needed. May take twice a day during allergy flares. May switch antihistamines every few months. Start dymista (fluticasone + azelastine nasal spray combination) 1 spray per nostril twice a day. This replaces all other nasal sprays. If it's not covered let us know.  Nasal saline spray (i.e., Simply Saline) or nasal saline lavage (i.e., NeilMed) is recommended as needed and prior to medicated nasal sprays. Consider allergy injections for long term control if above medications do not help the symptoms - handout given.  Follow up with ENT  Eczema See below for proper skin care.  Follow up in 2 months or sooner if needed.    Reducing Pollen Exposure Pollen seasons: trees (spring), grass (summer) and ragweed/weeds (fall). Keep windows closed in your home and car to lower pollen exposure.  Install air conditioning in the bedroom and throughout the house if possible.  Avoid going out in dry windy days - especially early morning. Pollen counts are highest between 5 - 10 AM and on dry, hot and windy days.  Save outside activities for late afternoon or after a heavy rain, when pollen levels are lower.  Avoid mowing of grass if you have grass pollen allergy. Be aware that pollen can also be transported indoors on people and pets.  Dry your clothes in an automatic dryer rather than hanging them outside where they might collect pollen.  Rinse hair and eyes before bedtime.  Control of House Dust Mite Allergen Dust mite allergens are a common trigger of allergy and asthma symptoms. While they can be found throughout the house, these microscopic creatures thrive in warm,  humid environments such as bedding, upholstered furniture and carpeting. Because so much time is spent in the bedroom, it is essential to reduce mite levels there.  Encase pillows, mattresses, and box springs in special allergen-proof fabric covers or airtight, zippered plastic covers.  Bedding should be washed weekly in hot water (130 F) and dried in a hot dryer. Allergen-proof covers are available for comforters and pillows that can't be regularly washed.  Wash the allergy-proof covers every few months. Minimize clutter in the bedroom. Keep pets out of the bedroom.  Keep humidity less than 50% by using a dehumidifier or air conditioning. You can buy a humidity measuring device called a hygrometer to monitor this.  If possible, replace carpets with hardwood, linoleum, or washable area rugs. If that's not possible, vacuum frequently with a vacuum that has a HEPA filter. Remove all upholstered furniture and non-washable window drapes from the bedroom. Remove all non-washable stuffed toys from the bedroom.  Wash stuffed toys weekly. Pet Allergen Avoidance: Contrary to popular opinion, there are no "hypoallergenic" breeds of dogs or cats. That is because people are not allergic to an animal's hair, but to an allergen found in the animal's saliva, dander (dead skin flakes) or urine. Pet allergy symptoms typically occur within minutes. For some people, symptoms can build up and become most severe 8 to 12 hours after contact with the animal. People with severe allergies can experience reactions in public places if dander has been transported on the pet owners' clothing. Keeping an animal outdoors is only a partial solution, since homes with pets in the yard still have higher concentrations of animal  allergens. Before getting a pet, ask your allergist to determine if you are allergic to animals. If your pet is already considered part of your family, try to minimize contact and keep the pet out of the bedroom  and other rooms where you spend a great deal of time. As with dust mites, vacuum carpets often or replace carpet with a hardwood floor, tile or linoleum. High-efficiency particulate air (HEPA) cleaners can reduce allergen levels over time. While dander and saliva are the source of cat and dog allergens, urine is the source of allergens from rabbits, hamsters, mice and [place] pigs; so ask a non-allergic family member to clean the animal's cage. If you have a pet allergy, talk to your allergist about the potential for allergy immunotherapy (allergy shots). This strategy can often provide long-term relief.   Mold Control Mold and fungi can grow on a variety of surfaces provided certain temperature and moisture conditions exist.  Outdoor molds grow on plants, decaying vegetation and soil. The major outdoor mold, Alternaria and Cladosporium, are found in very high numbers during hot and dry conditions. Generally, a late summer - fall peak is seen for common outdoor fungal spores. Rain will temporarily lower outdoor mold spore count, but counts rise rapidly when the rainy period ends. The most important indoor molds are Aspergillus and Penicillium. Dark, humid and poorly ventilated basements are ideal sites for mold growth. The next most common sites of mold growth are the bathroom and the kitchen. Outdoor (Seasonal) Mold Control Use air conditioning and keep windows closed. Avoid exposure to decaying vegetation. Avoid leaf raking. Avoid grain handling. Consider wearing a face mask if working in moldy areas.  Indoor (Perennial) Mold Control  Maintain humidity below 50%. Get rid of mold growth on hard surfaces with water, detergent and, if necessary, 5% bleach (do not mix with other cleaners). Then dry the area completely. If mold covers an area more than 10 square feet, consider hiring an indoor environmental professional. For clothing, washing with soap and water is best. If moldy items cannot be  cleaned and dried, throw them away. Remove sources e.g. contaminated carpets. Repair and seal leaking roofs or pipes. Using dehumidifiers in damp basements may be helpful, but empty the water and clean units regularly to prevent mildew from forming. All rooms, especially basements, bathrooms and kitchens, require ventilation and cleaning to deter mold and mildew growth. Avoid carpeting on concrete or damp floors, and storing items in damp areas.  Skin care recommendations  Bath time: Always use lukewarm water. AVOID very hot or cold water. Keep bathing time to 5-10 minutes. Do NOT use bubble bath. Use a mild soap and use just enough to wash the dirty areas. Do NOT scrub skin vigorously.  After bathing, pat dry your skin with a towel. Do NOT rub or scrub the skin.  Moisturizers and prescriptions:  ALWAYS apply moisturizers immediately after bathing (within 3 minutes). This helps to lock-in moisture. Use the moisturizer several times a day over the whole body. Good summer moisturizers include: Aveeno, CeraVe, Cetaphil. Good winter moisturizers include: Aquaphor, Vaseline, Cerave, Cetaphil, Eucerin, Vanicream. When using moisturizers along with medications, the moisturizer should be applied about one hour after applying the medication to prevent diluting effect of the medication or moisturize around where you applied the medications. When not using medications, the moisturizer can be continued twice daily as maintenance.  Laundry and clothing: Avoid laundry products with added color or perfumes. Use unscented hypo-allergenic laundry products such as Tide free,  Cheer free & gentle, and All free and clear.  If the skin still seems dry or sensitive, you can try double-rinsing the clothes. Avoid tight or scratchy clothing such as wool. Do not use fabric softeners or dyer sheets.

## 2022-10-10 ENCOUNTER — Other Ambulatory Visit: Payer: Self-pay | Admitting: Family Medicine

## 2022-10-10 MED ORDER — AMPHETAMINE-DEXTROAMPHETAMINE 10 MG PO TABS: 10.0000 mg | ORAL_TABLET | Freq: Two times a day (BID) | ORAL | 0 refills | Status: DC

## 2022-10-10 MED ORDER — LISDEXAMFETAMINE DIMESYLATE 70 MG PO CAPS
70.0000 mg | ORAL_CAPSULE | Freq: Every day | ORAL | 0 refills | Status: DC
Start: 2022-10-10 — End: 2022-11-08

## 2022-10-10 NOTE — Telephone Encounter (Signed)
Patient is needing a refill on Amphetamine and Lisdexamfetamine. Pharmacy is confirmed as [facility]

## 2022-10-10 NOTE — Telephone Encounter (Signed)
Requesting: adderall  Contract: 06/12/22 UDS: 06/12/22 Last Visit: 09/11/22 Next Visit: 3-6 mo f/u adhd Last Refill: 09/11/22 (60,0)  Requesting: vyvanse Contract: 06/12/22 UDS: 06/12/22 Last Visit: 09/11/22 Next Visit: 3-6 mo f/u adhd Last Refill: 09/11/22 (30,0)  Please Advise. Meds pending

## 2022-11-08 ENCOUNTER — Other Ambulatory Visit: Payer: Self-pay

## 2022-11-08 MED ORDER — LISDEXAMFETAMINE DIMESYLATE 70 MG PO CAPS
70.0000 mg | ORAL_CAPSULE | Freq: Every day | ORAL | 0 refills | Status: DC
Start: 2022-11-08 — End: 2022-12-09

## 2022-11-08 MED ORDER — AMPHETAMINE-DEXTROAMPHETAMINE 10 MG PO TABS: 10.0000 mg | ORAL_TABLET | Freq: Two times a day (BID) | ORAL | 0 refills | Status: DC

## 2022-11-08 NOTE — Telephone Encounter (Signed)
Requesting: adderall & vyvanse  Contract: 06/12/22 UDS: 06/12/22  Last Visit: 09/11/22 Next Visit: 3-6 mo f/u, not scheduled Last Refill: 10/10/22 (30,0), (60,0)  Please Advise. Meds pending

## 2022-11-08 NOTE — Telephone Encounter (Signed)
Patient refill request.  [facility]  amphetamine-dextroamphetamine (ADDERALL) 10 MG tablet   lisdexamfetamine (VYVANSE) 70 MG capsule

## 2022-12-09 ENCOUNTER — Other Ambulatory Visit: Payer: Self-pay | Admitting: Family Medicine

## 2022-12-09 ENCOUNTER — Other Ambulatory Visit: Payer: Self-pay

## 2022-12-09 MED ORDER — AMPHETAMINE-DEXTROAMPHETAMINE 10 MG PO TABS
10.0000 mg | ORAL_TABLET | Freq: Two times a day (BID) | ORAL | 0 refills | Status: DC
Start: 2022-12-09 — End: 2022-12-31

## 2022-12-09 MED ORDER — LISDEXAMFETAMINE DIMESYLATE 70 MG PO CAPS: 70.0000 mg | ORAL_CAPSULE | Freq: Every day | ORAL | 0 refills | Status: DC

## 2022-12-09 NOTE — Telephone Encounter (Signed)
Requesting: adderall Contract: 06/12/22 UDS: 06/12/22 Last Visit: 09/11/22 Next Visit: 3-6 mo f/u Last Refill: 11/08/22 (60,0)  Requesting: vyvanse Contract: 06/12/22 UDS: 06/12/22 Last Visit: 09/11/22 Next Visit: 3-6 mo f/u  Last Refill: 11/08/22 (30,0)  Please Advise. Meds pending

## 2022-12-09 NOTE — Telephone Encounter (Signed)
Patient refill request.  [facility]  amphetamine-dextroamphetamine (ADDERALL) 10 MG tablet   lisdexamfetamine (VYVANSE) 70 MG capsule  

## 2023-01-03 ENCOUNTER — Ambulatory Visit: Payer: BC Managed Care – PPO | Admitting: Family Medicine

## 2023-01-03 ENCOUNTER — Encounter: Payer: Self-pay | Admitting: Family Medicine

## 2023-01-03 VITALS — BP 136/93 | HR 117 | Wt 179.4 lb

## 2023-01-03 DIAGNOSIS — F3342 Major depressive disorder, recurrent, in full remission: Secondary | ICD-10-CM | POA: Diagnosis not present

## 2023-01-03 DIAGNOSIS — Z79899 Other long term (current) drug therapy: Secondary | ICD-10-CM | POA: Diagnosis not present

## 2023-01-03 DIAGNOSIS — R000 Tachycardia, unspecified: Secondary | ICD-10-CM

## 2023-01-03 DIAGNOSIS — F902 Attention-deficit hyperactivity disorder, combined type: Secondary | ICD-10-CM | POA: Diagnosis not present

## 2023-01-03 MED ORDER — LISDEXAMFETAMINE DIMESYLATE 70 MG PO CAPS: 70.0000 mg | ORAL_CAPSULE | Freq: Every day | ORAL | 0 refills | Status: DC

## 2023-01-03 MED ORDER — AMPHETAMINE-DEXTROAMPHETAMINE 10 MG PO TABS
10.0000 mg | ORAL_TABLET | Freq: Two times a day (BID) | ORAL | 0 refills | Status: DC
Start: 2023-01-03 — End: 2023-02-06

## 2023-01-03 MED ORDER — BUPROPION HCL ER (XL) 150 MG PO TB24: 150.0000 mg | ORAL_TABLET | Freq: Every day | ORAL | 1 refills | Status: DC

## 2023-01-03 NOTE — Progress Notes (Signed)
OFFICE VISIT  01/03/2023  CC:  Chief Complaint  Patient presents with   Follow-up    Follow up on Adderall. Wants to discuss some side effects.    Patient is a 32 y.o. male who presents for 79-month follow-up adult ADD. A/P as of last visit: "#1 adult ADD: Stable on Vyvanse 70 mg every morning and 10 mg immediate release/short acting Adderall twice a day.   Told substance contract and urine drug screen are up-to-date.   #2 recurrent major depressive disorder + GAD. Doing great on Wellbutrin XL 150 mg a day and Paxil CR 12.5 mg daily."  INTERIM HX: Nicholas Wu is happy to report that he has had a baby boy and gotten engaged since I last saw him.  All are well.  He was born on LEEP day.  He has recently been taking Claritin-D for his allergies.  He also takes Dymista nasal spray.   Attention and focus has not been quite as good as usual lately.  Mood and anxiety levels are very stable. PMP AWARE reviewed today: most recent rx for Adderall 10 mg was filled 12/09/2022, # 60, rx by me.  Most recent Vyvanse 70 mg prescription filled 12/09/2022, #30, prescription by me. No red flags.   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 Mack Hook with [facility] plans to do surgery as of 10/01/13.   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 Russella Dar recommended colonoscopy 12/2017   History of shingles    Pneumonia due to COVID-19 virus 05/2020   Tinea versicolor    Responded completely to ketoconazole oral (2018)   Tobacco dependence    Wrist fracture, closed 05/2016   left    Past Surgical History:  Procedure Laterality Date   NO PAST SURGERIES      Outpatient Medications Prior to Visit  Medication Sig Dispense Refill   Azelastine-Fluticasone 137-50 MCG/ACT SUSP Place 1 spray into the nose in the morning and at  bedtime. 23 g 5   PARoxetine (PAXIL-CR) 12.5 MG 24 hr tablet Take 1 tablet (12.5 mg total) by mouth daily. 90 tablet 0   amphetamine-dextroamphetamine (ADDERALL) 10 MG tablet Take 1 tablet (10 mg total) by mouth 2 (two) times daily. 60 tablet 0   buPROPion (WELLBUTRIN XL) 150 MG 24 hr tablet Take 1 tablet (150 mg total) by mouth daily. 90 tablet 1   lisdexamfetamine (VYVANSE) 70 MG capsule Take 1 capsule (70 mg total) by mouth daily. Do not fill less than 30 days from last fill. 30 capsule 0   No facility-administered medications prior to visit.    Allergies  Allergen Reactions   Nasonex [Mometasone] Other (See Comments)    Nasal irritation/nosebleeds    Review of Systems As per HPI  PE:    01/03/2023   10:07 AM 01/03/2023   10:03 AM 09/19/2022    1:39 PM  Vitals with BMI  Height   6\' 0"   Weight  179 lbs 6 oz 171 lbs  BMI   23.19  Systolic 136 138 256  Diastolic 93 94 80  Pulse  117 389     Physical Exam  Wt Readings from Last 2 Encounters:  01/03/23 179 lb 6.4 oz (81.4 kg)  09/19/22 171 lb (77.6 kg)    Gen: alert, oriented x  4, affect pleasant.  Lucid thinking and conversation noted. HEENT: PERRLA, EOMI.   Neck: no LAD, mass, or thyromegaly. CV: R regular rhythm, tachycardic to 120, no m/r/g LUNGS: CTA bilat, nonlabored. NEURO: no tremor or tics noted on observation.  Coordination intact. CN 2-12 grossly intact bilaterally, strength 5/5 in all extremeties.  No ataxia.   LABS:  None  IMPRESSION AND PLAN:  #1 ADHD, overall pretty stable. I told mom very hesitant to increase his stimulant so we will give his current issues with worsened focus a bit of time. Continue Vyvanse 70 mg a day and Adderall immediate release 10 mg twice daily.  #2 recurrent major depressive disorder + GAD. Doing great on Wellbutrin XL 150 mg a day and Paxil CR 12.5 mg daily.  #3 tachycardia. He usually runs heart rate in the 90s.  I think the decongestant he has been taking lately  has put him over into tachycardia.  I recommended he stop the decongestant and he was understanding and agreed this was a good plan.  #4 perennial allergic rhinitis. He has seen the allergist.  He will continue Dymista and he will take it nonsedating antihistamine without decongestant now. They recommended allergy immunotherapy but he is not ready to take this step yet.  An After Visit Summary was printed and given to the patient.  FOLLOW UP: Return in about 3 months (around 04/04/2023) for routine chronic illness f/u.  Signed:  Santiago Bumpers, MD           01/03/2023

## 2023-01-08 ENCOUNTER — Telehealth: Payer: Self-pay | Admitting: Family Medicine

## 2023-01-08 MED ORDER — PAROXETINE HCL ER 12.5 MG PO TB24: 12.5000 mg | ORAL_TABLET | Freq: Every day | ORAL | 0 refills | Status: DC

## 2023-01-08 NOTE — Telephone Encounter (Signed)
Patient is needing refill on PARoxetine (PAXIL-CR) 12.5 MG 24 hr tablet  and would like it to be sent to a new pharmacy the [facility] on [address] in [place]

## 2023-01-08 NOTE — Telephone Encounter (Signed)
Pt advised refill sent to new pharmacy. 

## 2023-02-06 ENCOUNTER — Other Ambulatory Visit: Payer: Self-pay

## 2023-02-06 MED ORDER — LISDEXAMFETAMINE DIMESYLATE 70 MG PO CAPS: 70.0000 mg | ORAL_CAPSULE | Freq: Every day | ORAL | 0 refills | Status: DC

## 2023-02-06 MED ORDER — AMPHETAMINE-DEXTROAMPHETAMINE 10 MG PO TABS: 10.0000 mg | ORAL_TABLET | Freq: Two times a day (BID) | ORAL | 0 refills | Status: DC

## 2023-02-06 NOTE — Telephone Encounter (Signed)
Requesting: adderall 10mg  Contract: 06/12/22 UDS: 06/12/22 Last Visit: 01/03/23 Next Visit: 3 mo f/u Last Refill: 01/03/23( (60,0)  Requesting: vyvanse 70mg  Contract: 06/12/22 UDS: 06/12/22 Last Visit: 01/03/23 Next Visit: 3 mo f/u Last Refill: 01/03/23 (30,0)  Please Advise. Meds pending

## 2023-02-06 NOTE — Telephone Encounter (Signed)
Patient refill request --- Nicholas Wu  amphetamine-dextroamphetamine (ADDERALL) 10 MG tablet    lisdexamfetamine (VYVANSE) 70 MG capsule

## 2023-03-07 ENCOUNTER — Telehealth: Payer: Self-pay

## 2023-03-07 NOTE — Telephone Encounter (Signed)
Refill requested for Adderall and Vyvanse sent to [facility] on [address]. Not due for next OV until 7/14

## 2023-03-07 NOTE — Telephone Encounter (Signed)
Patient refill request --- [facility] [place]  amphetamine-dextroamphetamine (ADDERALL) 10 MG tablet    lisdexamfetamine (VYVANSE) 70 MG capsule   

## 2023-03-09 MED ORDER — AMPHETAMINE-DEXTROAMPHETAMINE 10 MG PO TABS: 10.0000 mg | ORAL_TABLET | Freq: Two times a day (BID) | ORAL | 0 refills | Status: DC

## 2023-03-09 MED ORDER — LISDEXAMFETAMINE DIMESYLATE 70 MG PO CAPS
70.0000 mg | ORAL_CAPSULE | Freq: Every day | ORAL | 0 refills | Status: DC
Start: 2023-03-09 — End: 2023-04-04

## 2023-03-09 NOTE — Telephone Encounter (Signed)
Ok, adderall and vyvanse sent

## 2023-03-10 ENCOUNTER — Telehealth: Payer: Self-pay | Admitting: Family Medicine

## 2023-03-10 NOTE — Telephone Encounter (Signed)
Rx sen, please see other encounter

## 2023-03-10 NOTE — Telephone Encounter (Signed)
Patient needs refill on both   amphetamine-dextroamphetamine (ADDERALL) 10 MG tablet  lisdexamfetamine (VYVANSE) 70 MG capsule   [facility]

## 2023-04-04 NOTE — Patient Instructions (Signed)

## 2023-04-07 ENCOUNTER — Encounter: Payer: Self-pay | Admitting: Family Medicine

## 2023-04-07 ENCOUNTER — Ambulatory Visit (INDEPENDENT_AMBULATORY_CARE_PROVIDER_SITE_OTHER): Payer: BC Managed Care – PPO | Admitting: Family Medicine

## 2023-04-07 VITALS — BP 138/80 | HR 94

## 2023-04-07 DIAGNOSIS — F411 Generalized anxiety disorder: Secondary | ICD-10-CM

## 2023-04-07 DIAGNOSIS — T50905A Adverse effect of unspecified drugs, medicaments and biological substances, initial encounter: Secondary | ICD-10-CM | POA: Diagnosis not present

## 2023-04-07 DIAGNOSIS — R030 Elevated blood-pressure reading, without diagnosis of hypertension: Secondary | ICD-10-CM

## 2023-04-07 DIAGNOSIS — F3342 Major depressive disorder, recurrent, in full remission: Secondary | ICD-10-CM

## 2023-04-07 DIAGNOSIS — F988 Other specified behavioral and emotional disorders with onset usually occurring in childhood and adolescence: Secondary | ICD-10-CM

## 2023-04-07 MED ORDER — LISDEXAMFETAMINE DIMESYLATE 70 MG PO CAPS: 70.0000 mg | ORAL_CAPSULE | Freq: Every day | ORAL | 0 refills | Status: DC

## 2023-04-07 MED ORDER — AMPHETAMINE-DEXTROAMPHETAMINE 10 MG PO TABS
10.0000 mg | ORAL_TABLET | Freq: Two times a day (BID) | ORAL | 0 refills | Status: DC
Start: 2023-04-07 — End: 2023-05-01

## 2023-04-07 MED ORDER — PAROXETINE HCL 10 MG PO TABS: 10.0000 mg | ORAL_TABLET | Freq: Every day | ORAL | 0 refills | Status: DC

## 2023-04-07 MED ORDER — ESCITALOPRAM OXALATE 10 MG PO TABS: 10.0000 mg | ORAL_TABLET | Freq: Every day | ORAL | 1 refills | Status: DC

## 2023-04-07 NOTE — Progress Notes (Signed)
OFFICE VISIT  04/07/2023  CC:  Chief Complaint  Patient presents with   Medication Management    Pt states he is starting to develop side effects from Paxil. Weight gain, lethargic.      Patient is a 32 y.o. male who presents for 33-month follow-up adult ADD and recurrent major depressive disorder and GAD. A/P as of last visit: "1 ADHD, overall pretty stable. I told mom very hesitant to increase his stimulant so we will give his current issues with worsened focus a bit of time. Continue Vyvanse 70 mg a day and Adderall immediate release 10 mg twice daily.   #2 recurrent major depressive disorder + GAD. Doing great on Wellbutrin XL 150 mg a day and Paxil CR 12.5 mg daily.   #3 tachycardia. He usually runs heart rate in the 90s.  I think the decongestant he has been taking lately has put him over into tachycardia.  I recommended he stop the decongestant and he was understanding and agreed this was a good plan.   #4 perennial allergic rhinitis. He has seen the allergist.  He will continue Dymista and he will take it nonsedating antihistamine without decongestant now. They recommended allergy immunotherapy but he is not ready to take this step yet."  INTERIM HX: He feels like the Paxil CR 12.5 mg daily is making him tired and lethargic and have poor concentration and focus. The medication has helped him a lot for anxiety, though. He recalls doing well on Lexapro in the past for anxiety and depression.  Vyvanse 70 mg every morning and Adderall 10 mg plane tabs twice daily results in improved focus, concentration, task completion.  Less frustration, better multitasking, less impulsivity and restlessness.  Mood is stable. No side effects from the medication.  He feels like his blood pressure and heart rate have been mildly elevated since getting on Wellbutrin. He has not checked his blood pressure or heart rate at home.  ROS as above, plus--> no fevers, no CP, no SOB, no wheezing, no  cough, no dizziness, no HAs, no rashes, no melena/hematochezia.  No polyuria or polydipsia.  No myalgias or arthralgias.  No focal weakness, paresthesias, or tremors.  No acute vision or hearing abnormalities.  No dysuria or unusual/new urinary urgency or frequency.  No recent changes in lower legs. No n/v/d or abd pain.  No palpitations.    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 Mack Hook with [facility] plans to do surgery as of 10/01/13.   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 Russella Dar recommended colonoscopy 12/2017   History of shingles    Pneumonia due to COVID-19 virus 05/2020   Tinea versicolor    Responded completely to ketoconazole oral (2018)   Tobacco dependence    Wrist fracture, closed 05/2016   left    Past Surgical History:  Procedure Laterality Date   NO PAST SURGERIES      Outpatient Medications Prior to Visit  Medication Sig Dispense Refill   amphetamine-dextroamphetamine (ADDERALL) 10 MG tablet Take 1 tablet (10 mg total) by mouth 2 (two) times daily. 60 tablet 0   Azelastine-Fluticasone 137-50 MCG/ACT SUSP Place 1 spray into the nose in the morning and at bedtime. 23 g 5   buPROPion (WELLBUTRIN XL) 150 MG 24 hr tablet  Take 1 tablet (150 mg total) by mouth daily. 90 tablet 1   lisdexamfetamine (VYVANSE) 70 MG capsule Take 1 capsule (70 mg total) by mouth daily. Do not fill less than 30 days from last fill. 30 capsule 0   PARoxetine (PAXIL-CR) 12.5 MG 24 hr tablet Take 1 tablet (12.5 mg total) by mouth daily. 90 tablet 0   No facility-administered medications prior to visit.    Allergies  Allergen Reactions   Nasonex [Mometasone] Other (See Comments)    Nasal irritation/nosebleeds    Review of Systems As per HPI  PE:    04/07/2023    4:14 PM 04/07/2023    4:06 PM  01/03/2023   10:07 AM  Vitals with BMI  Systolic 138 149 409  Diastolic 80 50 93  Pulse  94    Physical Exam  Gen: Alert, well appearing.  Patient is oriented to person, place, time, and situation. AFFECT: pleasant, lucid thought and speech. No further exam today  LABS:  none  IMPRESSION AND PLAN:  #1 recurrent major depressive disorder, GAD. Paxil CR 12.5 mg has treated his anxiety well but has led to side effects of lethargy, poor concentration, and weight gain (164 pounds to 188 pounds). He has had a difficult time weaning off Paxil in the past so will take it very slow: Paxil 10 mg tab, half tab daily for 1 month then consider further decrease. Continue Wellbutrin XL 150 mg daily.  2. ADHD: his sx's have been a bit uncontrolled d/t the paxil side effect. Continue vyvanse 70 mg every day and adderall 10mg  bid. Controlled substance contract and urine drug screen up-to-date.  #3 elevated blood pressure, borderline elevated heart rate (last visit and this visit). Encouraged home monitoring.  An After Visit Summary was printed and given to the patient.  FOLLOW UP: No follow-ups on file.  Signed:  Santiago Bumpers, MD           04/07/2023

## 2023-04-08 ENCOUNTER — Other Ambulatory Visit: Payer: Self-pay

## 2023-04-08 NOTE — Telephone Encounter (Signed)
Please resend to pharmacy, rx pending

## 2023-04-08 NOTE — Telephone Encounter (Signed)
Patient was seen yesterday by Dr. Milinda Cave. [facility] not have V[facility]se in stock.  Please send prescription to [facility].  He has verified with pharmacy they have med in stock.   lisdexamfetamine (VYVANSE) 70 MG capsule   [facility] [place], [place] - [address]

## 2023-04-09 ENCOUNTER — Other Ambulatory Visit: Payer: Self-pay | Admitting: Family Medicine

## 2023-04-09 ENCOUNTER — Telehealth: Payer: Self-pay | Admitting: Family Medicine

## 2023-04-09 MED ORDER — LISDEXAMFETAMINE DIMESYLATE 70 MG PO CAPS: 70.0000 mg | ORAL_CAPSULE | Freq: Every day | ORAL | 0 refills | Status: DC

## 2023-04-09 NOTE — Telephone Encounter (Signed)
 Prescription sent

## 2023-04-09 NOTE — Telephone Encounter (Signed)
Patient states that lisdexamfetamine (VYVANSE) 70 MG capsul  is on backorder at his original pharmacy, however [facility] located in the [facility] in [place] has it in stock. Please resend medication to [facility] and let the patient know when it has been sent.

## 2023-04-10 ENCOUNTER — Other Ambulatory Visit: Payer: Self-pay | Admitting: Family Medicine

## 2023-04-11 ENCOUNTER — Other Ambulatory Visit: Payer: Self-pay | Admitting: Family Medicine

## 2023-04-24 ENCOUNTER — Other Ambulatory Visit (HOSPITAL_BASED_OUTPATIENT_CLINIC_OR_DEPARTMENT_OTHER): Payer: Self-pay

## 2023-05-01 NOTE — Patient Instructions (Signed)
It was very nice to see you today!   PLEASE NOTE:    Please give ample time to the testing facility, and our office to run, receive and review results. Please do not call inquiring of results, even if you can see them in your chart. We will contact you as soon as we are able. If it has been over 1 week since the test was completed, and you have not yet heard from [place], then please call us.   If we ordered any referrals today, please let us know if you have not heard from their office within the next 2 weeks. You should receive a letter via MyChart confirming if the referral was approved and their office contact information to schedule.

## 2023-05-02 ENCOUNTER — Encounter: Payer: Self-pay | Admitting: Family Medicine

## 2023-05-02 ENCOUNTER — Ambulatory Visit: Payer: BC Managed Care – PPO | Admitting: Family Medicine

## 2023-05-02 VITALS — BP 126/82 | HR 99 | Wt 190.2 lb

## 2023-05-02 DIAGNOSIS — R4189 Other symptoms and signs involving cognitive functions and awareness: Secondary | ICD-10-CM

## 2023-05-02 DIAGNOSIS — R635 Abnormal weight gain: Secondary | ICD-10-CM

## 2023-05-02 DIAGNOSIS — F3342 Major depressive disorder, recurrent, in full remission: Secondary | ICD-10-CM

## 2023-05-02 DIAGNOSIS — T50905D Adverse effect of unspecified drugs, medicaments and biological substances, subsequent encounter: Secondary | ICD-10-CM | POA: Diagnosis not present

## 2023-05-02 DIAGNOSIS — T43225D Adverse effect of selective serotonin reuptake inhibitors, subsequent encounter: Secondary | ICD-10-CM

## 2023-05-02 DIAGNOSIS — R5383 Other fatigue: Secondary | ICD-10-CM

## 2023-05-02 MED ORDER — BUPROPION HCL ER (XL) 150 MG PO TB24: 150.0000 mg | ORAL_TABLET | Freq: Every day | ORAL | 1 refills | Status: DC

## 2023-05-02 MED ORDER — LISDEXAMFETAMINE DIMESYLATE 70 MG PO CAPS: 70.0000 mg | ORAL_CAPSULE | Freq: Every day | ORAL | 0 refills | Status: DC

## 2023-05-02 MED ORDER — AMPHETAMINE-DEXTROAMPHETAMINE 10 MG PO TABS: 10.0000 mg | ORAL_TABLET | Freq: Two times a day (BID) | ORAL | 0 refills | Status: DC

## 2023-05-02 MED ORDER — ESCITALOPRAM OXALATE 10 MG PO TABS: 10.0000 mg | ORAL_TABLET | Freq: Every day | ORAL | 2 refills | Status: DC

## 2023-05-02 NOTE — Progress Notes (Signed)
OFFICE VISIT  05/02/2023  CC:  Chief Complaint  Patient presents with   Anxiety    /meds f/u. Pt states he does notice a change. Nothing bad to say.     Patient is a 32 y.o. male who presents for 3-week follow-up depression and anxiety. A/P as of last visit: "1 recurrent major depressive disorder, GAD. Paxil CR 12.5 mg has treated his anxiety well but has led to side effects of lethargy, poor concentration, and weight gain (164 pounds to 188 pounds). He has had a difficult time weaning off Paxil in the past so will take it very slow: Paxil 10 mg tab, half tab daily for 1 month then consider further decrease. Start Lexapro 10 mg daily. Continue Wellbutrin XL 150 mg daily.   2. ADHD: his sx's have been a bit uncontrolled d/t the paxil side effect. Continue vyvanse 70 mg every day and adderall 10mg  bid. Controlled substance contract and urine drug screen up-to-date.   #3 elevated blood pressure, borderline elevated heart rate (last visit and this visit). Encouraged home monitoring"  INTERIM HX: Rose feels well. Mood has been stable with his cross titration of Paxil and Lexapro since I last saw him.  He is currently taking 5 mg of the "plain" Paxil daily and 5 mg of the Lexapro.   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 Mack Hook with [facility] plans to do surgery as of 10/01/13.   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 Russella Dar recommended colonoscopy 12/2017   History of shingles    Pneumonia due to COVID-19 virus 05/2020   Tinea versicolor    Responded completely to ketoconazole oral (2018)   Tobacco dependence    Wrist fracture, closed 05/2016   left    Past Surgical History:  Procedure Laterality Date   NO PAST SURGERIES      Outpatient Medications Prior to Visit  Medication Sig  Dispense Refill   Azelastine-Fluticasone 137-50 MCG/ACT SUSP Place 1 spray into the nose in the morning and at bedtime. 23 g 5   PARoxetine (PAXIL) 10 MG tablet Take 1 tablet (10 mg total) by mouth daily. 60 tablet 0   amphetamine-dextroamphetamine (ADDERALL) 10 MG tablet Take 1 tablet (10 mg total) by mouth 2 (two) times daily. 60 tablet 0   buPROPion (WELLBUTRIN XL) 150 MG 24 hr tablet Take 1 tablet (150 mg total) by mouth daily. 90 tablet 1   escitalopram (LEXAPRO) 10 MG tablet Take 1 tablet (10 mg total) by mouth daily. 30 tablet 1   lisdexamfetamine (VYVANSE) 70 MG capsule Take 1 capsule (70 mg total) by mouth daily. Do not fill less than 30 days from last fill. 30 capsule 0   No facility-administered medications prior to visit.    Allergies  Allergen Reactions   Nasonex [Mometasone] Other (See Comments)    Nasal irritation/nosebleeds    Review of Systems As per HPI  PE:    05/02/2023   11:02 AM 04/07/2023    4:14 PM 04/07/2023    4:06 PM  Vitals with BMI  Weight 190 lbs 3 oz    Systolic 126 138 161  Diastolic 82 80 50  Pulse 99  94     Physical Exam  Gen: Alert, well appearing.  Patient is oriented to person,  place, time, and situation. AFFECT: pleasant, lucid thought and speech. No further exam today  LABS:  None  IMPRESSION AND PLAN:  #1 recurrent major depressive disorder, GAD. Paxil CR 12.5 mg has treated his anxiety well but has led to side effects of lethargy, poor concentration, and weight gain (164 pounds to 190 pounds). He is doing well with slow wean off Paxil and slow addition of Lexapro. He will continue gradually weaning off Paxil completely and getting to the 10 mg daily dose of Lexapro. Of note, Lexapro is causing vivid dreams but he is not bothered by this.  #2 ADD. Doing well on Vyvanse 70 mg a day and Adderall 10 mg twice daily.  An After Visit Summary was printed and given to the patient.  FOLLOW UP: Return in about 3 months (around  08/02/2023) for routine chronic illness f/u.  Signed:  Santiago Bumpers, MD           05/02/2023

## 2023-05-05 ENCOUNTER — Ambulatory Visit: Payer: BC Managed Care – PPO | Admitting: Family Medicine

## 2023-05-08 ENCOUNTER — Telehealth: Payer: Self-pay | Admitting: Family Medicine

## 2023-05-08 MED ORDER — LISDEXAMFETAMINE DIMESYLATE 70 MG PO CAPS: 70.0000 mg | ORAL_CAPSULE | Freq: Every day | ORAL | 0 refills | Status: DC

## 2023-05-08 NOTE — Telephone Encounter (Signed)
Prescription Request  05/08/2023  LOV: 05/02/2023  What is the name of the medication or equipment? lisdexamfetamine (VYVANSE) 70 MG capsule Medication is on backorder at [facility]. Please send to [facility]   Have you contacted your pharmacy to request a refill? Yes   Which pharmacy would you like this sent to?  [facility] [id number] [facility], [place] - [address] [place] [address] Phone: [phone] Fax: [phone]  [facility] - [place], [place] - [address] [facility] [address] [place] [address] Phone: [phone] Fax: [phone]    Patient notified that their request is being sent to the clinical staff for review and that they should receive a response within 2 business days.   Please advise at Mobile [phone] (mobile)

## 2023-05-08 NOTE — Telephone Encounter (Signed)
Ok rx sent.

## 2023-06-03 ENCOUNTER — Other Ambulatory Visit: Payer: Self-pay | Admitting: Family Medicine

## 2023-06-04 ENCOUNTER — Telehealth: Payer: Self-pay | Admitting: Family Medicine

## 2023-06-04 MED ORDER — AMPHETAMINE-DEXTROAMPHETAMINE 10 MG PO TABS: 10.0000 mg | ORAL_TABLET | Freq: Two times a day (BID) | ORAL | 0 refills | Status: DC

## 2023-06-04 MED ORDER — LISDEXAMFETAMINE DIMESYLATE 70 MG PO CAPS: 70.0000 mg | ORAL_CAPSULE | Freq: Every day | ORAL | 0 refills | Status: DC

## 2023-06-04 NOTE — Telephone Encounter (Signed)
  Prescription Request  06/04/2023  Patient would like both [facility] to go [facility] [place]. He also mentioned that [facility] only carries the name brand of Vyvanse no generic available there.  LOV: 05/02/2023  What is the name of the medication or equipment? lisdexamfetamine (VYVANSE) 70 MG capsule , amphetamine-dextroamphetamine (ADDERALL) 10 MG tablet   Have you contacted your pharmacy to request a refill? Yes   Which pharmacy would you like this sent to?  [facility] [id number] [place], [place] - [address] [place] [address] Phone: [phone] Fax: [phone]  [facility] - [place], [place] - [address] [place] [address] Phone: [phone] Fax: [phone]    Patient notified that their request is being sent to the clinical staff for review and that they should receive a response within 2 business days.   Please advise at Mobile [phone] (mobile)

## 2023-06-04 NOTE — Telephone Encounter (Signed)
Refills requested. Next OV not scheduled.

## 2023-06-04 NOTE — Telephone Encounter (Signed)
Okay, prescriptions sent

## 2023-07-03 ENCOUNTER — Other Ambulatory Visit: Payer: Self-pay | Admitting: Family Medicine

## 2023-07-04 ENCOUNTER — Other Ambulatory Visit: Payer: Self-pay | Admitting: Family Medicine

## 2023-07-04 NOTE — Telephone Encounter (Signed)
Prescription Request  07/04/2023  LOV: 05/02/2023  What is the name of the medication or equipment?   amphetamine-dextroamphetamine (ADDERALL) 10 MG tablet lisdexamfetamine (VYVANSE) 70 MG capsule  Pharmacy is the [facility] in [facility]    Have you contacted your pharmacy to request a refill? Yes   Which pharmacy would you like this sent to?  [facility] [id number] [facility] [place], [place] - [address] [place] [address] Phone: [phone] Fax: [phone]  [facility] - [place], [place] - [address] [place] [address] Phone: [phone] Fax: [phone]    Patient notified that their request is being sent to the clinical staff for review and that they should receive a response within 2 business days.   Please advise at Mobile [phone] (mobile)

## 2023-07-04 NOTE — Telephone Encounter (Signed)
Requesting: amphetamine-dextroamphetamine (ADDERALL) 10 MG tablet  Contract: 06/12/22 UDS: 06/12/22 Last Visit: 05/02/23 Next Visit: advised 3 month f/u Last Refill: 06/04/23 (60,0)  Requesting: lisdexamfetamine (VYVANSE) 70 MG capsule  Contract: 06/12/22 UDS: 06/12/22 Last Visit: 05/02/23 Next Visit: advised 3 month f/u  Last Refill: 06/04/23 (30,0)  Please Advise. Meds pending

## 2023-07-06 MED ORDER — AMPHETAMINE-DEXTROAMPHETAMINE 10 MG PO TABS: 10.0000 mg | ORAL_TABLET | Freq: Two times a day (BID) | ORAL | 0 refills | Status: DC

## 2023-07-06 MED ORDER — LISDEXAMFETAMINE DIMESYLATE 70 MG PO CAPS: 70.0000 mg | ORAL_CAPSULE | Freq: Every day | ORAL | 0 refills | Status: DC

## 2023-08-01 ENCOUNTER — Encounter: Payer: Self-pay | Admitting: Family Medicine

## 2023-08-01 ENCOUNTER — Ambulatory Visit (INDEPENDENT_AMBULATORY_CARE_PROVIDER_SITE_OTHER): Payer: BC Managed Care – PPO | Admitting: Family Medicine

## 2023-08-01 VITALS — BP 130/84 | HR 109 | Wt 191.2 lb

## 2023-08-01 DIAGNOSIS — F3342 Major depressive disorder, recurrent, in full remission: Secondary | ICD-10-CM

## 2023-08-01 DIAGNOSIS — F988 Other specified behavioral and emotional disorders with onset usually occurring in childhood and adolescence: Secondary | ICD-10-CM

## 2023-08-01 DIAGNOSIS — Z79899 Other long term (current) drug therapy: Secondary | ICD-10-CM

## 2023-08-01 MED ORDER — AMPHETAMINE-DEXTROAMPHETAMINE 10 MG PO TABS: 10.0000 mg | ORAL_TABLET | Freq: Two times a day (BID) | ORAL | 0 refills | Status: DC

## 2023-08-01 MED ORDER — ESCITALOPRAM OXALATE 10 MG PO TABS: 10.0000 mg | ORAL_TABLET | Freq: Every day | ORAL | 1 refills | Status: DC

## 2023-08-01 MED ORDER — LISDEXAMFETAMINE DIMESYLATE 70 MG PO CAPS: 70.0000 mg | ORAL_CAPSULE | Freq: Every day | ORAL | 0 refills | Status: DC

## 2023-08-01 NOTE — Progress Notes (Signed)
OFFICE VISIT  08/01/2023  CC:  Chief Complaint  Patient presents with   Anxiety    Pt states there has not been any noticeable side effects.    Depression    Patient is a 32 y.o. male who presents for 53-month follow-up adult ADD, anxiety, and depression. A/P as of last visit: "1 recurrent major depressive disorder, GAD. Paxil CR 12.5 mg has treated his anxiety well but has led to side effects of lethargy, poor concentration, and weight gain (164 pounds to 190 pounds). He is doing well with slow wean off Paxil and slow addition of Lexapro. He will continue gradually weaning off Paxil completely and getting to the 10 mg daily dose of Lexapro. Of note, Lexapro is causing vivid dreams but he is not bothered by this.   #2 ADD. Doing well on Vyvanse 70 mg a day and Adderall 10 mg twice daily."  INTERIM HX: [facility] is doing very well.  He is recently married. Continues to work very hard for [employer]. He is still taking just a sliver of his Paxil tab as part of a very slow taper.  He continues on the Lexapro 10 mg daily and Wellbutrin XL 150 mg daily. No rebound anxiety.  Mood is good.  Pt states all is going well with the med at current dosing (Vyvanse 70 mg a day and Adderall 10 mg twice daily): much improved focus, concentration, task completion.  Less frustration, better multitasking, less impulsivity and restlessness.  Mood is stable. No side effects from the medication.  PMP AWARE reviewed today: most recent rx for Adderall 10 mg was filled 07/07/2023, # 60, rx by me.  Most recent Vyvanse 70 mg prescription filled 07/06/2023, #30, prescription by me. No red flags.  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 Mack Hook with [facility] plans to do surgery as of 10/01/13.   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 Russella Dar recommended colonoscopy 12/2017   History of shingles    Pneumonia due to COVID-19 virus 05/2020   Tinea versicolor    Responded completely to ketoconazole oral (2018)   Tobacco dependence    Wrist fracture, closed 05/2016   left    Past Surgical History:  Procedure Laterality Date   NO PAST SURGERIES      Outpatient Medications Prior to Visit  Medication Sig Dispense Refill   Azelastine-Fluticasone 137-50 MCG/ACT SUSP Place 1 spray into the nose in the morning and at bedtime. 23 g 5   buPROPion (WELLBUTRIN XL) 150 MG 24 hr tablet Take 1 tablet (150 mg total) by mouth daily. 90 tablet 1   amphetamine-dextroamphetamine (ADDERALL) 10 MG tablet Take 1 tablet (10 mg total) by mouth 2 (two) times daily. 60 tablet 0   escitalopram (LEXAPRO) 10 MG tablet Take 1 tablet (10 mg total) by mouth daily. OFFICE VISIT NEEDED FOR FURTHER REFILLS 30 tablet 0   lisdexamfetamine (VYVANSE) 70 MG capsule Take 1 capsule (70 mg total) by mouth daily. Do not fill less than 30 days from last fill. 30 capsule 0   No facility-administered medications prior to visit.    Allergies  Allergen Reactions   Nasonex [Mometasone] Other (See Comments)    Nasal irritation/nosebleeds    Review of Systems As per HPI  PE:  08/01/2023    1:25 PM 05/02/2023   11:02 AM 04/07/2023    4:14 PM  Vitals with BMI  Weight 191 lbs 3 oz 190 lbs 3 oz   Systolic 130 126 865  Diastolic 84 82 80  Pulse 109 99   Body mass index is 25.93 kg/m.  Physical Exam  Gen: Alert, well appearing.  Patient is oriented to person, place, time, and situation. AFFECT: pleasant, lucid thought and speech. No further exam today  LABS:  none    IMPRESSION AND PLAN:  #1 recurrent major depressive disorder and GAD. He is stable and essentially completely weaned off the Paxil CR that he had been on long-term. He is now established on Lexapro 10 mg a day and continues on Wellbutrin XL 150 mg a day. His next  step in the near future will be to try to wean off the Wellbutrin.  However, for now he wants to keep things the same.  2.  Adult ADD, doing very well on Vyvanse 70 mg a day and Adderall 10 mg twice daily. Need urine drug screen at next follow-up.  An After Visit Summary was printed and given to the patient.  FOLLOW UP: Return in about 3 months (around 11/01/2023) for routine chronic illness f/u.  Signed:  Santiago Bumpers, MD           08/01/2023

## 2023-08-29 ENCOUNTER — Telehealth: Payer: Self-pay

## 2023-08-29 NOTE — Telephone Encounter (Signed)
Prescription Request  08/29/2023  LOV: Visit date not found  What is the name of the medication or equipment? 1-amphetamine-dextroamphetamine (ADDERALL) 10 MG tablet  2-lisdexamfetamine (VYVANSE) 70 MG capsule   Have you contacted your pharmacy to request a refill? Yes  Which pharmacy would you like this sent to?  [facility] [id number] [place], [place] - [address] [place] [address] Phone: [phone] Fax: [phone]  [facility] - [place], [place] - [address] [place] [address] Phone: [phone] Fax: [phone]    Patient notified that their request is being sent to the clinical staff for review and that they should receive a response within 2 business days.   Please advise at Mobile [phone] (mobile)

## 2023-08-31 MED ORDER — AMPHETAMINE-DEXTROAMPHETAMINE 10 MG PO TABS: 10.0000 mg | ORAL_TABLET | Freq: Two times a day (BID) | ORAL | 0 refills | Status: DC

## 2023-08-31 NOTE — Telephone Encounter (Signed)
Both rxs have been sent. 

## 2023-09-29 ENCOUNTER — Other Ambulatory Visit: Payer: Self-pay | Admitting: Family Medicine

## 2023-09-30 MED ORDER — LISDEXAMFETAMINE DIMESYLATE 70 MG PO CAPS: 70.0000 mg | ORAL_CAPSULE | Freq: Every day | ORAL | 0 refills | Status: DC

## 2023-09-30 MED ORDER — AMPHETAMINE-DEXTROAMPHETAMINE 10 MG PO TABS: 10.0000 mg | ORAL_TABLET | Freq: Two times a day (BID) | ORAL | 0 refills | Status: DC

## 2023-09-30 NOTE — Telephone Encounter (Signed)
 Refills requested for Vyvanse and Adderall sent to [facility] in [place].  Last OV 08/01/23 Next OV due 11/01/23

## 2023-10-28 ENCOUNTER — Encounter: Payer: Self-pay | Admitting: Family Medicine

## 2023-10-28 ENCOUNTER — Ambulatory Visit: Payer: BC Managed Care – PPO | Admitting: Family Medicine

## 2023-10-28 VITALS — BP 117/81 | HR 89 | Temp 98.5°F | Wt 184.8 lb

## 2023-10-28 DIAGNOSIS — F339 Major depressive disorder, recurrent, unspecified: Secondary | ICD-10-CM

## 2023-10-28 DIAGNOSIS — Z79899 Other long term (current) drug therapy: Secondary | ICD-10-CM

## 2023-10-28 DIAGNOSIS — F988 Other specified behavioral and emotional disorders with onset usually occurring in childhood and adolescence: Secondary | ICD-10-CM | POA: Diagnosis not present

## 2023-10-28 DIAGNOSIS — F411 Generalized anxiety disorder: Secondary | ICD-10-CM | POA: Diagnosis not present

## 2023-10-28 MED ORDER — LISDEXAMFETAMINE DIMESYLATE 70 MG PO CAPS: 70.0000 mg | ORAL_CAPSULE | Freq: Every day | ORAL | 0 refills | Status: DC

## 2023-10-28 MED ORDER — AMPHETAMINE-DEXTROAMPHETAMINE 10 MG PO TABS: 10.0000 mg | ORAL_TABLET | Freq: Two times a day (BID) | ORAL | 0 refills | Status: DC

## 2023-10-28 MED ORDER — OLOPATADINE HCL 0.1 % OP SOLN: OPHTHALMIC | 6 refills | Status: DC

## 2023-10-28 NOTE — Progress Notes (Signed)
 OFFICE VISIT  10/28/2023  CC: f/u add, anx/dep  Patient is a 33 y.o. male who presents for 29-month follow-up adult ADD, recurrent depressive disorder, and GAD. A/P as of last visit: #1 recurrent major depressive disorder and GAD. He is stable and essentially completely weaned off the Paxil  CR that he had been on long-term. He is now established on Lexapro  10 mg a day and continues on Wellbutrin  XL 150 mg a day. His next step in the near future will be to try to wean off the Wellbutrin .  However, for now he wants to keep things the same.   2.  Adult ADD, doing very well on Vyvanse  70 mg a day and Adderall 10 mg twice daily. Need urine drug screen at next follow-up.  INTERIM HX: Doing very well. He weaned off Wellbutrin  after last visit.  He notes that his heart rate has normalized and he feels great. Currently taking Lexapro  10 mg a day, Vyvanse  70 milligrams a day, and Adderall 10 mg IR twice daily.  Pt states all is going well with the med at current dosing: much improved focus, concentration, task completion.  Less frustration, better multitasking, less impulsivity and restlessness.  Mood is stable. No side effects from the medication.   PMP AWARE reviewed today: most recent rx for Adderall 10 mg was filled 09/30/2023, # 60, rx by me. Most recent Vyvanse  70 mg prescription filled 09/30/2023, #30, prescription by me. No red flags.  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 Alm Hummer with [facility] plans to do surgery as of 10/01/13.   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 Aneita recommended colonoscopy 12/2017   History of shingles    Pneumonia due to COVID-19 virus 05/2020   Tinea versicolor    Responded completely to ketoconazole  oral (2018)   Tobacco dependence    Wrist fracture, closed  05/2016   left    Past Surgical History:  Procedure Laterality Date   NO PAST SURGERIES      Outpatient Medications Prior to Visit  Medication Sig Dispense Refill   Azelastine -Fluticasone  137-50 MCG/ACT SUSP Place 1 spray into the nose in the morning and at bedtime. 23 g 5   escitalopram  (LEXAPRO ) 10 MG tablet Take 1 tablet (10 mg total) by mouth daily. 90 tablet 1   buPROPion  (WELLBUTRIN  XL) 150 MG 24 hr tablet Take 1 tablet (150 mg total) by mouth daily. (Patient not taking: Reported on 10/28/2023) 90 tablet 1   amphetamine -dextroamphetamine  (ADDERALL) 10 MG tablet Take 1 tablet (10 mg total) by mouth 2 (two) times daily. 60 tablet 0   lisdexamfetamine (VYVANSE ) 70 MG capsule Take 1 capsule (70 mg total) by mouth daily. Do not fill less than 30 days from last fill. 30 capsule 0   No facility-administered medications prior to visit.    Allergies  Allergen Reactions   Nasonex  [Mometasone ] Other (See Comments)    Nasal irritation/nosebleeds    Review of Systems As per HPI  PE:    10/28/2023   10:30 AM 08/01/2023    1:25 PM 05/02/2023   11:02 AM  Vitals with BMI  Weight 184 lbs 13 oz 191 lbs 3 oz 190 lbs 3 oz  Systolic 117 130 873  Diastolic 81 84 82  Pulse 89 109 99  Physical Exam  Gen: Alert, well appearing.  Patient is oriented to person, place, time, and situation. AFFECT: pleasant, lucid thought and speech. No further exam today  LABS:  Last CBC Lab Results  Component Value Date   WBC 5.4 11/12/2017   HGB 14.8 11/12/2017   HCT 43.9 11/12/2017   MCV 86.3 11/12/2017   MCH 30.2 05/15/2016   RDW 13.5 11/12/2017   PLT 291.0 11/12/2017   Last metabolic panel Lab Results  Component Value Date   GLUCOSE 88 11/12/2017   NA 138 11/12/2017   K 4.3 11/12/2017   CL 101 11/12/2017   CO2 31 11/12/2017   BUN 11 11/12/2017   CREATININE 0.93 11/12/2017   GFR 103.53 11/12/2017   CALCIUM 9.8 11/12/2017   PROT 7.2 11/12/2017   ALBUMIN 4.5 11/12/2017   BILITOT 0.7  11/12/2017   ALKPHOS 56 11/12/2017   AST 14 11/12/2017   ALT 14 11/12/2017   ANIONGAP 7 05/15/2016   IMPRESSION AND PLAN:  #1 adult ADD, doing well on Vyvanse  70 mg a day and Adderall 10 mg twice a day. Refills x 1 month supply today. We will do routine urine drug screen on him at next follow-up in 3 months.  2.  Recurrent major depressive disorder and GAD. He is doing very well on Lexapro  10 mg a day.  An After Visit Summary was printed and given to the patient.  FOLLOW UP: Return in about 3 months (around 01/25/2024) for routine chronic illness f/u.  Signed:  Gerlene Hockey, MD           10/28/2023

## 2023-10-29 ENCOUNTER — Other Ambulatory Visit: Payer: Self-pay | Admitting: Allergy

## 2023-11-28 ENCOUNTER — Other Ambulatory Visit: Payer: Self-pay

## 2023-11-28 MED ORDER — AMPHETAMINE-DEXTROAMPHETAMINE 10 MG PO TABS: 10.0000 mg | ORAL_TABLET | Freq: Two times a day (BID) | ORAL | 0 refills | Status: DC

## 2023-11-28 MED ORDER — LISDEXAMFETAMINE DIMESYLATE 70 MG PO CAPS: 70.0000 mg | ORAL_CAPSULE | Freq: Every day | ORAL | 0 refills | Status: DC

## 2023-11-28 NOTE — Telephone Encounter (Signed)
 Patient is needing a refill on the pending medications.   Requested Prescriptions   Pending Prescriptions Disp Refills   lisdexamfetamine (VYVANSE) 70 MG capsule 30 capsule 0    Sig: Take 1 capsule (70 mg total) by mouth daily. Do not fill less than 30 days from last fill.   amphetamine-dextroamphetamine (ADDERALL) 10 MG tablet 60 tablet 0    Sig: Take 1 tablet (10 mg total) by mouth 2 (two) times daily.

## 2023-12-24 NOTE — Patient Instructions (Signed)

## 2023-12-26 ENCOUNTER — Encounter: Payer: Self-pay | Admitting: Family Medicine

## 2023-12-26 ENCOUNTER — Ambulatory Visit: Admitting: Family Medicine

## 2023-12-26 VITALS — BP 134/84 | HR 98 | Wt 188.0 lb

## 2023-12-26 DIAGNOSIS — F3342 Major depressive disorder, recurrent, in full remission: Secondary | ICD-10-CM

## 2023-12-26 DIAGNOSIS — Z79899 Other long term (current) drug therapy: Secondary | ICD-10-CM

## 2023-12-26 DIAGNOSIS — J301 Allergic rhinitis due to pollen: Secondary | ICD-10-CM | POA: Diagnosis not present

## 2023-12-26 DIAGNOSIS — F411 Generalized anxiety disorder: Secondary | ICD-10-CM

## 2023-12-26 DIAGNOSIS — F988 Other specified behavioral and emotional disorders with onset usually occurring in childhood and adolescence: Secondary | ICD-10-CM | POA: Diagnosis not present

## 2023-12-26 MED ORDER — AMPHETAMINE-DEXTROAMPHETAMINE 20 MG PO TABS: 20.0000 mg | ORAL_TABLET | Freq: Two times a day (BID) | ORAL | 0 refills | Status: DC

## 2023-12-26 MED ORDER — LISDEXAMFETAMINE DIMESYLATE 70 MG PO CAPS: 70.0000 mg | ORAL_CAPSULE | Freq: Every day | ORAL | 0 refills | Status: DC

## 2023-12-26 MED ORDER — ESCITALOPRAM OXALATE 10 MG PO TABS: 10.0000 mg | ORAL_TABLET | Freq: Every day | ORAL | 1 refills | Status: DC

## 2023-12-26 MED ORDER — AZELASTINE-FLUTICASONE 137-50 MCG/ACT NA SUSP: 1.0000 | Freq: Two times a day (BID) | NASAL | 5 refills | Status: AC

## 2023-12-26 NOTE — Progress Notes (Signed)
 OFFICE VISIT  12/26/2023  CC:  Chief Complaint  Patient presents with   ADD    Patient is a 33 y.o. male who presents for follow-up adult ADD. A/P as of last visit: "#1 adult ADD, doing well on Vyvanse 70 mg a day and Adderall 10 mg twice a day. Refills x 1 month supply today. We will do routine urine drug screen on him at next follow-up in 3 months.   2.  Recurrent major depressive disorder and GAD. He is doing very well on Lexapro 10 mg a day."  INTERIM HX: Nasal congestion, sneezing, runny nose, itchy eyes all bothering him over the last 1 to 2 weeks persistently.  He is out of his nasal spray. He takes Careers adviser daily.  He had to take ephedrine today to help decongest a bit.  No facial pain, no facial swelling, no coughing or wheezing.  He has noted significant decrease in focus and concentration the last month or 2, worse the last couple of weeks. Denies euphoria or irritation/agitation. No depressed mood. He feels like his focus was better on Wellbutrin but his anxiety was higher.  He feels better off of this medication overall.  No tremulousness, no palpitations, no headaches or dizziness.  PMP AWARE reviewed today: most recent rx for Adderall 10 mg was filled 11/28/2023, # 60, rx by me. Most recent Vyvanse 70 mg prescription filled 11/28/2023, #30, prescription by me. No red flags.  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 Mack Hook with [facility] plans to do surgery as of 10/01/13.   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 Russella Dar recommended colonoscopy 12/2017   History of shingles    Pneumonia due to COVID-19 virus 05/2020   Tinea versicolor    Responded completely to ketoconazole oral (2018)   Tobacco dependence    Wrist fracture, closed 05/2016   left    Past Surgical History:   Procedure Laterality Date   NO PAST SURGERIES      Outpatient Medications Prior to Visit  Medication Sig Dispense Refill   olopatadine (PATADAY) 0.1 % ophthalmic solution 1 drop each eye bid prn 5 mL 6   amphetamine-dextroamphetamine (ADDERALL) 10 MG tablet Take 1 tablet (10 mg total) by mouth 2 (two) times daily. 60 tablet 0   Azelastine-Fluticasone 137-50 MCG/ACT SUSP Place 1 spray into the nose in the morning and at bedtime. 23 g 5   escitalopram (LEXAPRO) 10 MG tablet Take 1 tablet (10 mg total) by mouth daily. 90 tablet 1   buPROPion (WELLBUTRIN XL) 150 MG 24 hr tablet Take 1 tablet (150 mg total) by mouth daily. (Patient not taking: Reported on 12/26/2023) 90 tablet 1   lisdexamfetamine (VYVANSE) 70 MG capsule Take 1 capsule (70 mg total) by mouth daily. Do not fill less than 30 days from last fill. 30 capsule 0   No facility-administered medications prior to visit.    Allergies  Allergen Reactions   Nasonex [Mometasone] Other (See Comments)    Nasal irritation/nosebleeds    Review of Systems As per HPI  PE:    12/26/2023    2:46 PM 10/28/2023   10:30 AM 08/01/2023    1:25 PM  Vitals with BMI  Weight 188 lbs 184 lbs 13 oz 191 lbs 3 oz  Systolic  134 117 130  Diastolic 84 81 84  Pulse 98 89 109    Physical Exam  Gen: Alert, well appearing.  Patient is oriented to person, place, time, and situation. AFFECT: pleasant, lucid thought and speech. No further exam today  LABS:  none  IMPRESSION AND PLAN:  #1 adult ADD. Increase Adderall to 20 mg twice a day.  Continue Vyvanse 70 mg a day. Therapeutic expectations and side effect profile of medication discussed today.  Patient's questions answered. Urine drug screen today.  #2 recurrent major depressive disorder, GAD. Stable on Lexapro 10 mg a day.  3.  Allergic rhinitis.  Refilled azelastine-fluticasone nasal spray, 1 spray in each nostril twice a day. He will continue over-the-counter Allegra daily.  An After  Visit Summary was printed and given to the patient.  FOLLOW UP: Return in about 3 months (around 03/26/2024) for routine chronic illness f/u.  Signed:  Santiago Bumpers, MD           12/26/2023

## 2023-12-28 LAB — DRUG MONITORING PANEL 376104, URINE
Amphetamine: 2313 ng/mL — ABNORMAL HIGH (ref <250)
Amphetamines: POSITIVE ng/mL — AB (ref <500)
Barbiturates: NEGATIVE ng/mL (ref <300)
Benzodiazepines: NEGATIVE ng/mL (ref <100)
Cocaine Metabolite: NEGATIVE ng/mL (ref <150)
Desmethyltramadol: NEGATIVE ng/mL (ref <100)
Methamphetamine: NEGATIVE ng/mL (ref <250)
Opiates: NEGATIVE ng/mL (ref <100)
Oxycodone: NEGATIVE ng/mL (ref <100)
Tramadol: NEGATIVE ng/mL (ref <100)

## 2023-12-28 LAB — DM TEMPLATE

## 2024-01-23 ENCOUNTER — Telehealth: Payer: Self-pay

## 2024-01-23 MED ORDER — LISDEXAMFETAMINE DIMESYLATE 70 MG PO CAPS: 70.0000 mg | ORAL_CAPSULE | Freq: Every day | ORAL | 0 refills | Status: DC

## 2024-01-23 MED ORDER — AMPHETAMINE-DEXTROAMPHETAMINE 20 MG PO TABS: 20.0000 mg | ORAL_TABLET | Freq: Two times a day (BID) | ORAL | 0 refills | Status: DC

## 2024-01-23 NOTE — Telephone Encounter (Signed)
Okay.  Sent.

## 2024-01-23 NOTE — Addendum Note (Signed)
 Addended by: Shelvia Dick on: 01/23/2024 03:06 PM   Modules accepted: Orders

## 2024-01-23 NOTE — Telephone Encounter (Signed)
 Pt stopped by the office and needs refill on Vyvanse  and Adderall sent to [facility]. Next OV not due until 03/26/24

## 2024-01-23 NOTE — Telephone Encounter (Signed)
Pt advised refills sent. °

## 2024-02-20 ENCOUNTER — Other Ambulatory Visit: Payer: Self-pay

## 2024-02-20 MED ORDER — AMPHETAMINE-DEXTROAMPHETAMINE 20 MG PO TABS: 20.0000 mg | ORAL_TABLET | Freq: Two times a day (BID) | ORAL | 0 refills | Status: DC

## 2024-02-20 MED ORDER — LISDEXAMFETAMINE DIMESYLATE 70 MG PO CAPS: 70.0000 mg | ORAL_CAPSULE | Freq: Every day | ORAL | 0 refills | Status: DC

## 2024-02-20 NOTE — Telephone Encounter (Signed)
 Requesting: vyvanse  & adderall Contract: 01/02/24 UDS:01/02/24 Last Visit:01/02/24  Next Visit:03/22/24 Last Refill: 01/23/24 (30,0)(60,0)  Please Advise

## 2024-03-22 ENCOUNTER — Ambulatory Visit: Admitting: Family Medicine

## 2024-03-22 ENCOUNTER — Encounter: Payer: Self-pay | Admitting: Family Medicine

## 2024-03-22 VITALS — BP 127/76 | HR 96 | Temp 98.1°F | Ht 72.0 in | Wt 183.6 lb

## 2024-03-22 DIAGNOSIS — F988 Other specified behavioral and emotional disorders with onset usually occurring in childhood and adolescence: Secondary | ICD-10-CM | POA: Diagnosis not present

## 2024-03-22 DIAGNOSIS — Z79899 Other long term (current) drug therapy: Secondary | ICD-10-CM

## 2024-03-22 DIAGNOSIS — F411 Generalized anxiety disorder: Secondary | ICD-10-CM

## 2024-03-22 DIAGNOSIS — F3342 Major depressive disorder, recurrent, in full remission: Secondary | ICD-10-CM | POA: Diagnosis not present

## 2024-03-22 MED ORDER — LISDEXAMFETAMINE DIMESYLATE 70 MG PO CAPS: 70.0000 mg | ORAL_CAPSULE | Freq: Every day | ORAL | 0 refills | Status: DC

## 2024-03-22 MED ORDER — AMPHETAMINE-DEXTROAMPHETAMINE 20 MG PO TABS: 20.0000 mg | ORAL_TABLET | Freq: Two times a day (BID) | ORAL | 0 refills | Status: DC

## 2024-03-22 NOTE — Progress Notes (Signed)
 OFFICE VISIT  03/22/2024  CC: No chief complaint on file.   Patient is a 33 y.o. male who presents for 81-month follow-up adult ADD, depression, and general anxiety. A/P as of last visit: #1 adult ADD. Increase Adderall to 20 mg twice a day.  Continue Vyvanse  70 mg a day. Therapeutic expectations and side effect profile of medication discussed today.  Patient's questions answered. Urine drug screen today.   #2 recurrent major depressive disorder, GAD. Stable on Lexapro  10 mg a day.   3.  Allergic rhinitis.  Refilled azelastine -fluticasone  nasal spray, 1 spray in each nostril twice a day. He will continue over-the-counter Allegra daily.  INTERIM HX: Doing very well. He and his wife are expecting their second child.  His son is 96 months old. He has very physical work at [employer].  Mood good, anxiety well-controlled.  Pt states all is going well with the med at current dosing: Vyvanse  70 mg daily and Adderall 20 mg twice daily): much improved focus, concentration, task completion.  Less frustration, better multitasking, less impulsivity and restlessness.  Mood is stable. No side effects from the medication.  PMP AWARE reviewed today: most recent rx for Vyvanse  70 mg was filled 02/23/2024, # 30, rx by me.  Most recent Adderall 20 mg prescription filled 02/21/2024, #60, prescription by me. No red flags.  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 Alm Hummer with [facility] plans to do surgery as of 10/01/13.   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 Aneita recommended colonoscopy 12/2017   History of shingles    Pneumonia due to COVID-19 virus 05/2020   Tinea versicolor    Responded completely to ketoconazole  oral (2018)   Tobacco dependence    Wrist fracture, closed 05/2016   left     Past Surgical History:  Procedure Laterality Date   NO PAST SURGERIES      Outpatient Medications Prior to Visit  Medication Sig Dispense Refill   Azelastine -Fluticasone  137-50 MCG/ACT SUSP Place 1 spray into the nose in the morning and at bedtime. 23 g 5   escitalopram  (LEXAPRO ) 10 MG tablet Take 1 tablet (10 mg total) by mouth daily. 90 tablet 1   olopatadine  (PATADAY ) 0.1 % ophthalmic solution 1 drop each eye bid prn 5 mL 6   amphetamine -dextroamphetamine  (ADDERALL) 20 MG tablet Take 1 tablet (20 mg total) by mouth 2 (two) times daily. 60 tablet 0   lisdexamfetamine (VYVANSE ) 70 MG capsule Take 1 capsule (70 mg total) by mouth daily. Do not fill less than 30 days from last fill. 30 capsule 0   No facility-administered medications prior to visit.    Allergies  Allergen Reactions   Nasonex  [Mometasone ] Other (See Comments)    Nasal irritation/nosebleeds    Review of Systems As per HPI  PE:    03/22/2024    2:52 PM 12/26/2023    2:46 PM 10/28/2023   10:30 AM  Vitals with BMI  Height 6' 0    Weight 183 lbs 10 oz 188 lbs 184 lbs 13 oz  BMI 24.9    Systolic 127 134 882  Diastolic 76 84 81  Pulse 96 98 89     Physical Exam  Gen: Alert, well appearing.  Patient is oriented to person, place, time, and situation.  AFFECT: pleasant, lucid thought and speech. No further exam today  LABS:  none  IMPRESSION AND PLAN:  #1 adult ADD. Stable on Vyvanse  70 mg every morning and Adderall 20 mg twice daily. No adverse side effects from the medication. Refill x 1 month supply of each today. Controlled substance contract and urine drug screen are up-to-date.  2.  Recurrent major depression, GAD. Doing well long-term on Lexapro  10 mg a day.  An After Visit Summary was printed and given to the patient.  FOLLOW UP: No follow-ups on file.  Signed:  Gerlene Hockey, MD           03/22/2024

## 2024-04-05 ENCOUNTER — Encounter: Payer: Self-pay | Admitting: Family Medicine

## 2024-04-05 MED ORDER — AZITHROMYCIN 250 MG PO TABS
ORAL_TABLET | ORAL | 0 refills | Status: DC
Start: 2024-04-05 — End: 2024-06-17

## 2024-04-05 NOTE — Telephone Encounter (Signed)
 Z-Pak prescription sent to [facility]

## 2024-04-07 ENCOUNTER — Other Ambulatory Visit: Payer: Self-pay | Admitting: Family Medicine

## 2024-04-07 DIAGNOSIS — J329 Chronic sinusitis, unspecified: Secondary | ICD-10-CM

## 2024-04-19 ENCOUNTER — Encounter: Payer: Self-pay | Admitting: Family Medicine

## 2024-04-19 MED ORDER — LISDEXAMFETAMINE DIMESYLATE 70 MG PO CAPS: 70.0000 mg | ORAL_CAPSULE | Freq: Every day | ORAL | 0 refills | Status: DC

## 2024-04-19 MED ORDER — AMPHETAMINE-DEXTROAMPHETAMINE 20 MG PO TABS: 20.0000 mg | ORAL_TABLET | Freq: Two times a day (BID) | ORAL | 0 refills | Status: DC

## 2024-04-19 NOTE — Telephone Encounter (Signed)
 See other encounter, Message sent to provider for review of medication refills.

## 2024-04-19 NOTE — Telephone Encounter (Signed)
 Requesting: amphetamine -dextroamphetamine  (ADDERALL) 20 MG tablet & lisdexamfetamine (VYVANSE ) 70 MG capsule   Contract: 12/26/23 UDS: 12/26/23 Last Visit: 03/22/24 Next Visit: 06/21/24 Last Refill: 03/22/24(60,0) & 03/22/24 (30,0  Please Advise. Rx pending for both

## 2024-05-20 ENCOUNTER — Encounter: Payer: Self-pay | Admitting: Family Medicine

## 2024-05-20 MED ORDER — AMPHETAMINE-DEXTROAMPHETAMINE 20 MG PO TABS: 20.0000 mg | ORAL_TABLET | Freq: Two times a day (BID) | ORAL | 0 refills | Status: DC

## 2024-05-20 MED ORDER — LISDEXAMFETAMINE DIMESYLATE 70 MG PO CAPS: 70.0000 mg | ORAL_CAPSULE | Freq: Every day | ORAL | 0 refills | Status: DC

## 2024-05-20 NOTE — Telephone Encounter (Signed)
 Requesting: Adderall 20mg  Contract: 12/26/23 UDS: 12/26/23 Last Visit: 03/22/24 Next Visit: 06/21/24 Last Refill: 04/19/24 (60,0)  Requesting: Vyvanse  70mg  Contract: 12/26/23 UDS: 12/26/23 Last Visit: 03/22/24 Next Visit: 06/21/24 Last Refill: 04/19/24 (30,0)  Please Advise. Rx pending

## 2024-06-18 ENCOUNTER — Encounter: Payer: Self-pay | Admitting: Family Medicine

## 2024-06-18 ENCOUNTER — Ambulatory Visit: Admitting: Family Medicine

## 2024-06-18 VITALS — BP 113/75 | HR 75 | Temp 98.4°F | Ht 72.0 in | Wt 183.8 lb

## 2024-06-18 DIAGNOSIS — F411 Generalized anxiety disorder: Secondary | ICD-10-CM | POA: Diagnosis not present

## 2024-06-18 DIAGNOSIS — F3342 Major depressive disorder, recurrent, in full remission: Secondary | ICD-10-CM

## 2024-06-18 DIAGNOSIS — L309 Dermatitis, unspecified: Secondary | ICD-10-CM | POA: Diagnosis not present

## 2024-06-18 DIAGNOSIS — Z79899 Other long term (current) drug therapy: Secondary | ICD-10-CM

## 2024-06-18 DIAGNOSIS — F988 Other specified behavioral and emotional disorders with onset usually occurring in childhood and adolescence: Secondary | ICD-10-CM | POA: Diagnosis not present

## 2024-06-18 MED ORDER — LISDEXAMFETAMINE DIMESYLATE 70 MG PO CAPS: 70.0000 mg | ORAL_CAPSULE | Freq: Every day | ORAL | 0 refills | Status: DC

## 2024-06-18 MED ORDER — ESCITALOPRAM OXALATE 10 MG PO TABS: 10.0000 mg | ORAL_TABLET | Freq: Every day | ORAL | 3 refills | Status: AC

## 2024-06-18 MED ORDER — AMPHETAMINE-DEXTROAMPHETAMINE 20 MG PO TABS: 20.0000 mg | ORAL_TABLET | Freq: Two times a day (BID) | ORAL | 0 refills | Status: DC

## 2024-06-18 MED ORDER — FLUTICASONE PROPIONATE 0.05 % EX CREA: TOPICAL_CREAM | Freq: Two times a day (BID) | CUTANEOUS | 1 refills | Status: AC

## 2024-06-18 NOTE — Progress Notes (Signed)
 OFFICE VISIT  06/18/2024  CC:  Chief Complaint  Patient presents with   Medical Management of Chronic Issues    3 month f/u; has eczema concern   Patient is a 33 y.o. male who presents for 20-month follow-up adult ADD, depression, and general anxiety. A/P as of last visit: #1 adult ADD. Stable on Vyvanse  70 mg every morning and Adderall 20 mg twice daily. No adverse side effects from the medication. Refill x 1 month supply of each today. Controlled substance contract and urine drug screen are up-to-date.   2.  Recurrent major depression, GAD. Doing well long-term on Lexapro  10 mg a day.  INTERIM HX: Doing well. Anxiety and mood levels stable. Enjoying his toddler and working hard.  Pt states all is going well with the med at current dosing (Vyvanse  70 mg every morning and Adderall 20 mg twice daily): much improved focus, concentration, task completion.  Less frustration, better multitasking, less impulsivity and restlessness.  Mood is stable. No side effects from the medication.  Has had some itchy rash on the left lower abdomen last couple of weeks.  It has gradually cleared up with hydrocortisone.  He says this is a spot where he gets eczema recurrently.  PMP AWARE reviewed today: most recent rx for Vyvanse  70 mg was filled 05/21/2024, # 30, rx by me.  Most recent Adderall 20 mg prescription was filled 05/20/2024, #60, prescription by me. No red flags.  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 Alm Hummer with [facility] plans to do surgery as of 10/01/13.   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 Aneita recommended colonoscopy 12/2017   History of shingles    Pneumonia due to COVID-19 virus 05/2020   Tinea versicolor    Responded completely to ketoconazole  oral (2018)   Tobacco dependence     Wrist fracture, closed 05/2016   left    Past Surgical History:  Procedure Laterality Date   NO PAST SURGERIES      Outpatient Medications Prior to Visit  Medication Sig Dispense Refill   amphetamine -dextroamphetamine  (ADDERALL) 20 MG tablet Take 1 tablet (20 mg total) by mouth 2 (two) times daily. 60 tablet 0   Azelastine -Fluticasone  137-50 MCG/ACT SUSP Place 1 spray into the nose in the morning and at bedtime. 23 g 5   escitalopram  (LEXAPRO ) 10 MG tablet Take 1 tablet (10 mg total) by mouth daily. 90 tablet 1   lisdexamfetamine (VYVANSE ) 70 MG capsule Take 1 capsule (70 mg total) by mouth daily. Do not fill less than 30 days from last fill. 30 capsule 0   azithromycin  (ZITHROMAX ) 250 MG tablet 2 tabs po qd x 1d, then 1 tab po qd x 4d 6 tablet 0   olopatadine  (PATADAY ) 0.1 % ophthalmic solution 1 drop each eye bid prn (Patient not taking: Reported on 06/18/2024) 5 mL 6   No facility-administered medications prior to visit.    Allergies  Allergen Reactions   Nasonex  [Mometasone ] Other (See Comments)    Nasal irritation/nosebleeds    Review of Systems As per HPI  PE:    06/18/2024   10:46 AM 03/22/2024    2:52 PM 12/26/2023    2:46 PM  Vitals with BMI  Height 6' 0 6' 0   Weight 183 lbs 13 oz  183 lbs 10 oz 188 lbs  BMI 24.92 24.9   Systolic 113 127 865  Diastolic 75 76 84  Pulse 75 96 98     Physical Exam  General: Alert and well-appearing. Left lower abdomen with faint pink macular rash with a bit of excoriation  LABS:  Last CBC Lab Results  Component Value Date   WBC 5.4 11/12/2017   HGB 14.8 11/12/2017   HCT 43.9 11/12/2017   MCV 86.3 11/12/2017   MCH 30.2 05/15/2016   RDW 13.5 11/12/2017   PLT 291.0 11/12/2017   Last metabolic panel Lab Results  Component Value Date   GLUCOSE 88 11/12/2017   NA 138 11/12/2017   K 4.3 11/12/2017   CL 101 11/12/2017   CO2 31 11/12/2017   BUN 11 11/12/2017   CREATININE 0.93 11/12/2017   GFR 103.53 11/12/2017    CALCIUM 9.8 11/12/2017   PROT 7.2 11/12/2017   ALBUMIN 4.5 11/12/2017   BILITOT 0.7 11/12/2017   ALKPHOS 56 11/12/2017   AST 14 11/12/2017   ALT 14 11/12/2017   ANIONGAP 7 05/15/2016   IMPRESSION AND PLAN:  #1 adult ADD. Stable on Vyvanse  70 mg every morning and Adderall 20 mg twice daily. No adverse side effects from the medication. Refill x 1 month supply of each today. Controlled substance contract and urine drug screen are up-to-date.   2.  Recurrent major depression, GAD. Doing well long-term on Lexapro  10 mg a day Refilled today.  #3 eczema. Cutivate  0.05% cream, apply twice daily as needed.  An After Visit Summary was printed and given to the patient.  FOLLOW UP: No follow-ups on file.  Signed:  Gerlene Hockey, MD           06/18/2024

## 2024-06-21 ENCOUNTER — Ambulatory Visit: Admitting: Family Medicine

## 2024-07-16 ENCOUNTER — Encounter: Payer: Self-pay | Admitting: Family Medicine

## 2024-07-16 MED ORDER — LISDEXAMFETAMINE DIMESYLATE 70 MG PO CAPS: 70.0000 mg | ORAL_CAPSULE | Freq: Every day | ORAL | 0 refills | Status: DC

## 2024-07-16 MED ORDER — AMPHETAMINE-DEXTROAMPHETAMINE 20 MG PO TABS: 20.0000 mg | ORAL_TABLET | Freq: Two times a day (BID) | ORAL | 0 refills | Status: DC

## 2024-07-16 NOTE — Telephone Encounter (Signed)
 Rx sent.

## 2024-08-13 ENCOUNTER — Encounter: Payer: Self-pay | Admitting: Family Medicine

## 2024-08-13 MED ORDER — AMPHETAMINE-DEXTROAMPHETAMINE 20 MG PO TABS: 20.0000 mg | ORAL_TABLET | Freq: Two times a day (BID) | ORAL | 0 refills | Status: DC

## 2024-08-13 MED ORDER — LISDEXAMFETAMINE DIMESYLATE 70 MG PO CAPS: 70.0000 mg | ORAL_CAPSULE | Freq: Every day | ORAL | 0 refills | Status: DC

## 2024-08-13 NOTE — Telephone Encounter (Signed)
 Requesting:adderall and vyvanse  Contract:yes UDS:12/26/23 Last Visit:06/18/24 Next Visit:n/a Last Refill:07/16/24 (60,0)(30,0)  Please Advise

## 2024-08-18 ENCOUNTER — Telehealth: Payer: Self-pay

## 2024-08-18 ENCOUNTER — Encounter: Payer: Self-pay | Admitting: Family Medicine

## 2024-08-18 ENCOUNTER — Other Ambulatory Visit (HOSPITAL_COMMUNITY): Payer: Self-pay

## 2024-08-18 ENCOUNTER — Other Ambulatory Visit: Payer: Self-pay

## 2024-08-18 MED ORDER — LISDEXAMFETAMINE DIMESYLATE 70 MG PO CAPS: 70.0000 mg | ORAL_CAPSULE | Freq: Every day | ORAL | 0 refills | Status: DC

## 2024-08-18 NOTE — Telephone Encounter (Signed)
 Pharmacy Patient Advocate Encounter   Received notification from Onbase that prior authorization for Vyvanse  70MG  Caps is required/requested.   Insurance verification completed.   The patient is insured through [facility].   Per test claim: Refill too soon. PA is not needed at this time. Medication was filled 08/18/24. Next eligible fill date is 09/11/24.  -Insurance covered generic, rx ready at pharmacy. Copay $5.00

## 2024-08-18 NOTE — Telephone Encounter (Signed)
 Tried calling patient, unable to LVM

## 2024-08-18 NOTE — Telephone Encounter (Signed)
 Rx sent.

## 2024-09-10 ENCOUNTER — Ambulatory Visit: Admitting: Family Medicine

## 2024-09-10 ENCOUNTER — Encounter: Payer: Self-pay | Admitting: Family Medicine

## 2024-09-10 VITALS — BP 124/75 | HR 93 | Temp 97.3°F | Ht 72.0 in | Wt 185.4 lb

## 2024-09-10 DIAGNOSIS — F988 Other specified behavioral and emotional disorders with onset usually occurring in childhood and adolescence: Secondary | ICD-10-CM

## 2024-09-10 MED ORDER — AMPHETAMINE-DEXTROAMPHETAMINE 20 MG PO TABS: 20.0000 mg | ORAL_TABLET | Freq: Two times a day (BID) | ORAL | 0 refills | Status: DC

## 2024-09-10 MED ORDER — LISDEXAMFETAMINE DIMESYLATE 70 MG PO CAPS: 70.0000 mg | ORAL_CAPSULE | Freq: Every day | ORAL | 0 refills | Status: DC

## 2024-09-10 NOTE — Progress Notes (Signed)
 OFFICE VISIT  09/10/2024  CC:  Chief Complaint  Patient presents with   Medical Management of Chronic Issues    Patient is a 33 y.o. male who presents for 2-month follow-up adult ADD. A/P as of last visit: #1 adult ADD. Stable on Vyvanse  70 mg every morning and Adderall 20 mg twice daily. No adverse side effects from the medication. Refill x 1 month supply of each today. Controlled substance contract and urine drug screen are up-to-date.   2.  Recurrent major depression, GAD. Doing well long-term on Lexapro  10 mg a day Refilled today.   #3 eczema. Cutivate  0.05% cream, apply twice daily as needed.  INTERIM HX: Nicholas Wu is doing well. Very busy time at work for him.  He is looking forward to after the holidays when things slow down some.   PMP AWARE reviewed today: most recent rx for Vyvanse  70 mg was filled 08/18/2024, # 30, rx by me.  Most recent Adderall 20 mg prescription filled 08/13/2024, #60, prescription by me. No red flags.   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 Alm Hummer with [facility] plans to do surgery as of 10/01/13.   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 Aneita recommended colonoscopy 12/2017   History of shingles    Pneumonia due to COVID-19 virus 05/2020   Tinea versicolor    Responded completely to ketoconazole  oral (2018)   Tobacco dependence    Wrist fracture, closed 05/2016   left    Past Surgical History:  Procedure Laterality Date   NO PAST SURGERIES      Outpatient Medications Prior to Visit  Medication Sig Dispense Refill   Azelastine -Fluticasone  137-50 MCG/ACT SUSP Place 1 spray into the nose in the morning and at bedtime. 23 g 5   escitalopram  (LEXAPRO ) 10 MG tablet Take 1 tablet (10 mg total) by mouth daily. 90 tablet 3   fluticasone   (CUTIVATE ) 0.05 % cream Apply topically 2 (two) times daily. 30 g 1   amphetamine -dextroamphetamine  (ADDERALL) 20 MG tablet Take 1 tablet (20 mg total) by mouth 2 (two) times daily. 60 tablet 0   lisdexamfetamine  (VYVANSE ) 70 MG capsule Take 1 capsule (70 mg total) by mouth daily. Do not fill less than 30 days from last fill. 30 capsule 0   No facility-administered medications prior to visit.    Allergies[1]  Review of Systems As per HPI  PE:    09/10/2024   11:06 AM 06/18/2024   10:46 AM 03/22/2024    2:52 PM  Vitals with BMI  Height 6' 0 6' 0 6' 0  Weight 185 lbs 6 oz 183 lbs 13 oz 183 lbs 10 oz  BMI 25.14 24.92 24.9  Systolic 124 113 872  Diastolic 75 75 76  Pulse 93 75 96     Physical Exam  Gen: Alert, well appearing.  Patient is oriented to person, place, time, and situation. AFFECT: pleasant, lucid thought and speech. No further exam today  LABS:  none  IMPRESSION AND PLAN:  Adult ADD, Doing well/stable. Continue Vyvanse  70 mg a day and Adderall 20 mg twice daily. Refills x 1 month supply sent today.  An After Visit Summary was printed and given to the patient.  FOLLOW UP: No follow-ups on file.  Signed:  Phil Faysal Fenoglio, MD  09/10/2024     [1]  Allergies Allergen Reactions   Nasonex  [Mometasone ] Other (See Comments)    Nasal irritation/nosebleeds

## 2024-09-14 ENCOUNTER — Encounter: Payer: Self-pay | Admitting: Family Medicine

## 2024-09-14 MED ORDER — LISDEXAMFETAMINE DIMESYLATE 70 MG PO CAPS: 70.0000 mg | ORAL_CAPSULE | Freq: Every day | ORAL | 0 refills | Status: DC

## 2024-09-14 NOTE — Telephone Encounter (Signed)
 Pt advised refill resent to [facility] due to rx issue at [facility]. [facility] contacted to cancel rx.

## 2024-09-15 ENCOUNTER — Other Ambulatory Visit (HOSPITAL_COMMUNITY): Payer: Self-pay

## 2024-09-17 ENCOUNTER — Other Ambulatory Visit (HOSPITAL_BASED_OUTPATIENT_CLINIC_OR_DEPARTMENT_OTHER): Payer: Self-pay

## 2024-09-17 MED ORDER — AMPHETAMINE-DEXTROAMPHETAMINE 20 MG PO TABS
20.0000 mg | ORAL_TABLET | Freq: Two times a day (BID) | ORAL | 0 refills | Status: DC
  Filled 2024-09-17: qty 60, 30d supply, fill #0

## 2024-09-17 MED ORDER — LISDEXAMFETAMINE DIMESYLATE 70 MG PO CAPS
70.0000 mg | ORAL_CAPSULE | Freq: Every day | ORAL | 0 refills | Status: DC
  Filled 2024-09-17: qty 30, 30d supply, fill #0

## 2024-09-17 NOTE — Addendum Note (Signed)
 Addended by: FLETA CARE D on: 09/17/2024 11:15 AM   Modules accepted: Orders

## 2024-09-17 NOTE — Telephone Encounter (Signed)
 AMORE GRATER to P [facility] (supporting Aleene VEAR Hockey, MD) (Selected Message)     09/17/24 10:47 AM Well it looks like [facility] is getting hit with shortages now too. I talked to [facility] at [facility] and they did confirm that they are in stock.  Is it ok if we send it there?   thank y'all, hope you had a great Christmas

## 2024-09-17 NOTE — Telephone Encounter (Signed)
 Vyvanse  and Adderall prescriptions sent to [facility]

## 2024-09-17 NOTE — Addendum Note (Signed)
 Addended by: CANDISE ALEENE DEL on: 09/17/2024 12:02 PM   Modules accepted: Orders

## 2024-10-08 ENCOUNTER — Encounter: Payer: Self-pay | Admitting: Family Medicine

## 2024-10-08 MED ORDER — LISDEXAMFETAMINE DIMESYLATE 70 MG PO CAPS: 70.0000 mg | ORAL_CAPSULE | Freq: Every day | ORAL | 0 refills | Status: DC

## 2024-10-08 MED ORDER — AMPHETAMINE-DEXTROAMPHETAMINE 20 MG PO TABS: 20.0000 mg | ORAL_TABLET | Freq: Two times a day (BID) | ORAL | 0 refills | Status: AC

## 2024-10-08 NOTE — Telephone Encounter (Signed)
 Prescriptions sent

## 2024-10-18 ENCOUNTER — Encounter: Payer: Self-pay | Admitting: Family Medicine

## 2024-10-18 ENCOUNTER — Other Ambulatory Visit (HOSPITAL_BASED_OUTPATIENT_CLINIC_OR_DEPARTMENT_OTHER): Payer: Self-pay

## 2024-10-18 ENCOUNTER — Other Ambulatory Visit (HOSPITAL_COMMUNITY): Payer: Self-pay

## 2024-10-18 ENCOUNTER — Other Ambulatory Visit: Payer: Self-pay | Admitting: Family Medicine

## 2024-10-18 MED ORDER — VYVANSE 70 MG PO CAPS
70.0000 mg | ORAL_CAPSULE | Freq: Every day | ORAL | 0 refills | Status: AC
  Filled 2024-10-18: qty 30, 30d supply, fill #0

## 2024-10-18 NOTE — Telephone Encounter (Signed)
 Rx sent.

## 2024-10-19 ENCOUNTER — Encounter: Payer: Self-pay | Admitting: Family Medicine

## 2024-10-19 ENCOUNTER — Other Ambulatory Visit (HOSPITAL_BASED_OUTPATIENT_CLINIC_OR_DEPARTMENT_OTHER): Payer: Self-pay

## 2024-10-19 NOTE — Telephone Encounter (Signed)
 No further action needed.

## 2024-10-19 NOTE — Telephone Encounter (Signed)
 No further action needed at this time.
